# Patient Record
Sex: Male | Born: 1935 | Race: White | Hispanic: No | Marital: Married
Health system: Southern US, Community
[De-identification: ages and names within clinical notes are randomized; demographics above are authoritative.]

---

## 2005-01-18 ENCOUNTER — Inpatient Hospital Stay: Payer: Self-pay | Admitting: Internal Medicine

## 2005-01-18 ENCOUNTER — Other Ambulatory Visit: Payer: Self-pay

## 2005-04-20 ENCOUNTER — Ambulatory Visit: Payer: Self-pay | Admitting: Unknown Physician Specialty

## 2007-06-20 ENCOUNTER — Emergency Department: Payer: Self-pay | Admitting: Internal Medicine

## 2007-06-20 ENCOUNTER — Other Ambulatory Visit: Payer: Self-pay

## 2007-06-23 ENCOUNTER — Ambulatory Visit: Payer: Self-pay | Admitting: Internal Medicine

## 2007-07-29 ENCOUNTER — Other Ambulatory Visit: Payer: Self-pay

## 2007-07-31 ENCOUNTER — Inpatient Hospital Stay: Payer: Self-pay | Admitting: *Deleted

## 2010-12-06 ENCOUNTER — Inpatient Hospital Stay: Payer: Self-pay | Admitting: Family Medicine

## 2011-03-08 ENCOUNTER — Emergency Department: Payer: Self-pay | Admitting: Emergency Medicine

## 2011-03-30 ENCOUNTER — Ambulatory Visit: Payer: Self-pay | Admitting: Surgery

## 2011-04-06 ENCOUNTER — Ambulatory Visit: Payer: Self-pay | Admitting: Surgery

## 2011-06-06 ENCOUNTER — Ambulatory Visit: Payer: Self-pay | Admitting: Internal Medicine

## 2011-06-29 ENCOUNTER — Inpatient Hospital Stay: Payer: Self-pay | Admitting: Family Medicine

## 2011-07-07 ENCOUNTER — Ambulatory Visit: Payer: Self-pay | Admitting: Internal Medicine

## 2011-09-03 ENCOUNTER — Emergency Department: Payer: Self-pay | Admitting: Emergency Medicine

## 2011-09-03 LAB — BASIC METABOLIC PANEL
BUN: 27 mg/dL — ABNORMAL HIGH (ref 7–18)
Calcium, Total: 8.9 mg/dL (ref 8.5–10.1)
Chloride: 101 mmol/L (ref 98–107)
Creatinine: 1.13 mg/dL (ref 0.60–1.30)
EGFR (African American): >60
EGFR (Non-African Amer.): >60
Glucose: 457 mg/dL — ABNORMAL HIGH (ref 65–99)
Potassium: 4.2 mmol/L (ref 3.5–5.1)
Sodium: 136 mmol/L (ref 136–145)

## 2011-09-03 LAB — URINALYSIS, COMPLETE
Bacteria: NONE SEEN
Glucose,UR: >=500 mg/dL (ref 0–75)
Hyaline Cast: 9
Ph: 6 (ref 4.5–8.0)
RBC,UR: 2 /HPF (ref 0–5)
Specific Gravity: 1.02 (ref 1.003–1.030)
WBC UR: <1 /HPF (ref 0–5)

## 2011-09-03 LAB — CBC
HCT: 35.3 % — ABNORMAL LOW (ref 40.0–52.0)
HGB: 12.1 g/dL — ABNORMAL LOW (ref 13.0–18.0)
MCH: 34.3 pg — ABNORMAL HIGH (ref 26.0–34.0)
MCHC: 34.2 g/dL (ref 32.0–36.0)
RDW: 14.8 % — ABNORMAL HIGH (ref 11.5–14.5)

## 2011-09-03 LAB — PROTIME-INR
INR: 1
Prothrombin Time: 13.7 secs (ref 11.5–14.7)

## 2011-09-03 LAB — APTT: Activated PTT: 33.7 secs (ref 23.6–35.9)

## 2011-10-20 ENCOUNTER — Emergency Department: Payer: Self-pay | Admitting: *Deleted

## 2011-10-21 LAB — URINALYSIS, COMPLETE
Bacteria: NONE SEEN
Glucose,UR: 150 mg/dL (ref 0–75)
RBC,UR: 4 /HPF (ref 0–5)
Specific Gravity: 1.028 (ref 1.003–1.030)
WBC UR: 4 /HPF (ref 0–5)

## 2011-11-12 ENCOUNTER — Emergency Department: Payer: Self-pay | Admitting: *Deleted

## 2012-01-11 ENCOUNTER — Emergency Department: Payer: Self-pay | Admitting: *Deleted

## 2012-01-11 LAB — CBC
MCH: 33.8 pg (ref 26.0–34.0)
MCHC: 33.8 g/dL (ref 32.0–36.0)
MCV: 100 fL (ref 80–100)
Platelet: 194 10*3/uL (ref 150–440)
RBC: 3.58 10*6/uL — ABNORMAL LOW (ref 4.40–5.90)
WBC: 4.8 10*3/uL (ref 3.8–10.6)

## 2012-01-11 LAB — HEPATIC FUNCTION PANEL A (ARMC)
Albumin: 3.2 g/dL — ABNORMAL LOW (ref 3.4–5.0)
Bilirubin, Direct: 0.1 mg/dL (ref 0.00–0.20)
Bilirubin,Total: 0.5 mg/dL (ref 0.2–1.0)
Total Protein: 6.1 g/dL — ABNORMAL LOW (ref 6.4–8.2)

## 2012-01-12 LAB — CK TOTAL AND CKMB (NOT AT ARMC)
CK, Total: 131 U/L (ref 35–232)
CK-MB: 3.3 ng/mL (ref 0.5–3.6)

## 2012-01-12 LAB — URINALYSIS, COMPLETE
Bacteria: NONE SEEN
Glucose,UR: >=500 mg/dL (ref 0–75)
Ketone: NEGATIVE
Leukocyte Esterase: NEGATIVE
Specific Gravity: 1.021 (ref 1.003–1.030)
Squamous Epithelial: NONE SEEN

## 2012-01-12 LAB — BASIC METABOLIC PANEL
Anion Gap: 9 (ref 7–16)
BUN: 25 mg/dL — ABNORMAL HIGH (ref 7–18)
Calcium, Total: 8.8 mg/dL (ref 8.5–10.1)
Chloride: 103 mmol/L (ref 98–107)
Co2: 29 mmol/L (ref 21–32)
Creatinine: 0.94 mg/dL (ref 0.60–1.30)
Osmolality: 302 (ref 275–301)

## 2012-01-13 ENCOUNTER — Ambulatory Visit: Payer: Self-pay | Admitting: Urology

## 2012-01-14 ENCOUNTER — Emergency Department: Payer: Self-pay | Admitting: Emergency Medicine

## 2012-01-14 LAB — URINALYSIS, COMPLETE
Bilirubin,UR: NEGATIVE
Glucose,UR: 150 mg/dL (ref 0–75)
Ketone: NEGATIVE
Leukocyte Esterase: NEGATIVE
Nitrite: NEGATIVE
RBC,UR: 1 /HPF (ref 0–5)
Squamous Epithelial: NONE SEEN
WBC UR: 1 /HPF (ref 0–5)

## 2012-01-14 LAB — COMPREHENSIVE METABOLIC PANEL
Albumin: 3.5 g/dL (ref 3.4–5.0)
Anion Gap: 9 (ref 7–16)
BUN: 11 mg/dL (ref 7–18)
Bilirubin,Total: 0.5 mg/dL (ref 0.2–1.0)
Calcium, Total: 8.4 mg/dL — ABNORMAL LOW (ref 8.5–10.1)
Co2: 30 mmol/L (ref 21–32)
Creatinine: 0.63 mg/dL (ref 0.60–1.30)
EGFR (Non-African Amer.): >60
Osmolality: 283 (ref 275–301)
Potassium: 3.1 mmol/L — ABNORMAL LOW (ref 3.5–5.1)
SGPT (ALT): 21 U/L
Sodium: 141 mmol/L (ref 136–145)

## 2012-01-14 LAB — CBC
HGB: 13.5 g/dL (ref 13.0–18.0)
MCHC: 34.4 g/dL (ref 32.0–36.0)
RDW: 13.9 % (ref 11.5–14.5)
WBC: 5.8 10*3/uL (ref 3.8–10.6)

## 2012-01-14 LAB — TROPONIN I: Troponin-I: 0.03 ng/mL

## 2012-01-17 ENCOUNTER — Inpatient Hospital Stay: Payer: Self-pay | Admitting: Family Medicine

## 2012-01-17 LAB — COMPREHENSIVE METABOLIC PANEL
Albumin: 3.5 g/dL (ref 3.4–5.0)
BUN: 18 mg/dL (ref 7–18)
Chloride: 98 mmol/L (ref 98–107)
Co2: 27 mmol/L (ref 21–32)
Creatinine: 0.87 mg/dL (ref 0.60–1.30)
EGFR (Non-African Amer.): >60
Glucose: 404 mg/dL — ABNORMAL HIGH (ref 65–99)
Osmolality: 293 (ref 275–301)
Potassium: 4 mmol/L (ref 3.5–5.1)
SGOT(AST): 17 U/L (ref 15–37)
Sodium: 137 mmol/L (ref 136–145)
Total Protein: 6.7 g/dL (ref 6.4–8.2)

## 2012-01-17 LAB — URINALYSIS, COMPLETE
Glucose,UR: >=500 mg/dL (ref 0–75)
Hyaline Cast: 4
Ph: 5 (ref 4.5–8.0)
Protein: NEGATIVE
Specific Gravity: 1.016 (ref 1.003–1.030)
WBC UR: 7 /HPF (ref 0–5)

## 2012-01-17 LAB — CK TOTAL AND CKMB (NOT AT ARMC)
CK, Total: 103 U/L (ref 35–232)
CK-MB: 2.9 ng/mL (ref 0.5–3.6)

## 2012-01-17 LAB — CBC
HCT: 38.9 % — ABNORMAL LOW (ref 40.0–52.0)
MCHC: 34.2 g/dL (ref 32.0–36.0)
MCV: 101 fL — ABNORMAL HIGH (ref 80–100)
RDW: 13.8 % (ref 11.5–14.5)

## 2012-01-17 LAB — TROPONIN I: Troponin-I: 0.03 ng/mL

## 2012-01-18 LAB — CBC WITH DIFFERENTIAL/PLATELET
Basophil #: 0 10*3/uL (ref 0.0–0.1)
Basophil %: 0.3 %
Eosinophil #: 0.1 10*3/uL (ref 0.0–0.7)
Eosinophil %: 0.8 %
HCT: 38.7 % — ABNORMAL LOW (ref 40.0–52.0)
HGB: 13.5 g/dL (ref 13.0–18.0)
Lymphocyte #: 0.9 10*3/uL — ABNORMAL LOW (ref 1.0–3.6)
Lymphocyte %: 11.1 %
MCH: 34.7 pg — ABNORMAL HIGH (ref 26.0–34.0)
MCHC: 34.8 g/dL (ref 32.0–36.0)
Monocyte #: 0.5 x10 3/mm (ref 0.2–1.0)
Neutrophil #: 6.6 10*3/uL — ABNORMAL HIGH (ref 1.4–6.5)

## 2012-01-18 LAB — COMPREHENSIVE METABOLIC PANEL
Albumin: 3.1 g/dL — ABNORMAL LOW (ref 3.4–5.0)
Anion Gap: 11 (ref 7–16)
BUN: 10 mg/dL (ref 7–18)
Bilirubin,Total: 1.1 mg/dL — ABNORMAL HIGH (ref 0.2–1.0)
Chloride: 100 mmol/L (ref 98–107)
Creatinine: 0.7 mg/dL (ref 0.60–1.30)
Glucose: 245 mg/dL — ABNORMAL HIGH (ref 65–99)
Osmolality: 288 (ref 275–301)
SGOT(AST): 16 U/L (ref 15–37)
Sodium: 141 mmol/L (ref 136–145)
Total Protein: 6.4 g/dL (ref 6.4–8.2)

## 2012-01-19 LAB — CBC WITH DIFFERENTIAL/PLATELET
Basophil %: 0.2 %
Eosinophil #: 0.1 10*3/uL (ref 0.0–0.7)
Eosinophil %: 0.6 %
HCT: 37.6 % — ABNORMAL LOW (ref 40.0–52.0)
HGB: 12.7 g/dL — ABNORMAL LOW (ref 13.0–18.0)
Lymphocyte %: 15.7 %
MCH: 33.2 pg (ref 26.0–34.0)
MCHC: 33.8 g/dL (ref 32.0–36.0)
Monocyte #: 0.6 x10 3/mm (ref 0.2–1.0)
Neutrophil #: 7 10*3/uL — ABNORMAL HIGH (ref 1.4–6.5)
Neutrophil %: 76.9 %

## 2012-01-19 LAB — BASIC METABOLIC PANEL
Anion Gap: 10 (ref 7–16)
BUN: 8 mg/dL (ref 7–18)
Chloride: 103 mmol/L (ref 98–107)
Co2: 29 mmol/L (ref 21–32)
Glucose: 33 mg/dL — CL (ref 65–99)
Osmolality: 278 (ref 275–301)
Potassium: 2.7 mmol/L — ABNORMAL LOW (ref 3.5–5.1)

## 2012-01-19 LAB — LIPASE, BLOOD: Lipase: 83 U/L (ref 73–393)

## 2012-01-19 LAB — URINE CULTURE

## 2012-01-20 LAB — BASIC METABOLIC PANEL
Anion Gap: 10 (ref 7–16)
BUN: 9 mg/dL (ref 7–18)
Co2: 29 mmol/L (ref 21–32)
Creatinine: 0.54 mg/dL — ABNORMAL LOW (ref 0.60–1.30)
EGFR (African American): >60
EGFR (Non-African Amer.): >60
Glucose: 100 mg/dL — ABNORMAL HIGH (ref 65–99)
Osmolality: 280 (ref 275–301)
Sodium: 141 mmol/L (ref 136–145)

## 2012-01-21 LAB — BASIC METABOLIC PANEL
BUN: 19 mg/dL — ABNORMAL HIGH (ref 7–18)
Calcium, Total: 8.5 mg/dL (ref 8.5–10.1)
Co2: 33 mmol/L — ABNORMAL HIGH (ref 21–32)
Creatinine: 0.79 mg/dL (ref 0.60–1.30)
EGFR (African American): >60
Potassium: 3.6 mmol/L (ref 3.5–5.1)
Sodium: 143 mmol/L (ref 136–145)

## 2012-01-21 LAB — URINALYSIS, COMPLETE
Bacteria: NONE SEEN
Bilirubin,UR: NEGATIVE
Glucose,UR: >=500 mg/dL (ref 0–75)
Ketone: NEGATIVE
Leukocyte Esterase: NEGATIVE
Nitrite: NEGATIVE
RBC,UR: 3 /HPF (ref 0–5)
Specific Gravity: 1.028 (ref 1.003–1.030)
WBC UR: 1 /HPF (ref 0–5)

## 2012-01-22 LAB — BASIC METABOLIC PANEL
Anion Gap: 7 (ref 7–16)
BUN: 19 mg/dL — ABNORMAL HIGH (ref 7–18)
Chloride: 105 mmol/L (ref 98–107)
EGFR (Non-African Amer.): >60
Osmolality: 287 (ref 275–301)
Potassium: 3.9 mmol/L (ref 3.5–5.1)
Sodium: 142 mmol/L (ref 136–145)

## 2012-01-23 LAB — CULTURE, BLOOD (SINGLE)

## 2012-02-18 ENCOUNTER — Inpatient Hospital Stay: Payer: Self-pay | Admitting: Family Medicine

## 2012-02-18 LAB — COMPREHENSIVE METABOLIC PANEL
Alkaline Phosphatase: 127 U/L (ref 50–136)
Bilirubin,Total: 0.4 mg/dL (ref 0.2–1.0)
Calcium, Total: 8.9 mg/dL (ref 8.5–10.1)
Chloride: 105 mmol/L (ref 98–107)
Co2: 27 mmol/L (ref 21–32)
EGFR (African American): >60
EGFR (Non-African Amer.): >60
Glucose: 320 mg/dL — ABNORMAL HIGH (ref 65–99)
Potassium: 3.5 mmol/L (ref 3.5–5.1)
SGOT(AST): 21 U/L (ref 15–37)
SGPT (ALT): 22 U/L (ref 12–78)

## 2012-02-18 LAB — URINALYSIS, COMPLETE
Bacteria: NONE SEEN
Bilirubin,UR: NEGATIVE
Glucose,UR: >=500 mg/dL (ref 0–75)
Leukocyte Esterase: NEGATIVE
Nitrite: NEGATIVE
Ph: 5 (ref 4.5–8.0)
Protein: NEGATIVE

## 2012-02-18 LAB — CK TOTAL AND CKMB (NOT AT ARMC)
CK, Total: 82 U/L (ref 35–232)
CK-MB: 4.1 ng/mL — ABNORMAL HIGH (ref 0.5–3.6)

## 2012-02-18 LAB — TROPONIN I
Troponin-I: <0.02 ng/mL
Troponin-I: 0.02 ng/mL

## 2012-02-18 LAB — MAGNESIUM: Magnesium: 2 mg/dL

## 2012-02-18 LAB — CBC
HGB: 13.6 g/dL (ref 13.0–18.0)
MCH: 34.1 pg — ABNORMAL HIGH (ref 26.0–34.0)
MCV: 100 fL (ref 80–100)
Platelet: 172 10*3/uL (ref 150–440)
RBC: 3.99 10*6/uL — ABNORMAL LOW (ref 4.40–5.90)
WBC: 4.7 10*3/uL (ref 3.8–10.6)

## 2012-02-19 LAB — BASIC METABOLIC PANEL
Anion Gap: 7 (ref 7–16)
Chloride: 105 mmol/L (ref 98–107)
Co2: 29 mmol/L (ref 21–32)
EGFR (Non-African Amer.): >60
Osmolality: 278 (ref 275–301)
Potassium: 2.6 mmol/L — ABNORMAL LOW (ref 3.5–5.1)
Sodium: 141 mmol/L (ref 136–145)

## 2012-02-19 LAB — CBC WITH DIFFERENTIAL/PLATELET
Basophil #: 0 10*3/uL (ref 0.0–0.1)
Basophil %: 0.2 %
Eosinophil #: 0.1 10*3/uL (ref 0.0–0.7)
Eosinophil %: 1.6 %
HGB: 12.7 g/dL — ABNORMAL LOW (ref 13.0–18.0)
Lymphocyte %: 34.1 %
MCH: 33.4 pg (ref 26.0–34.0)
MCHC: 34 g/dL (ref 32.0–36.0)
MCV: 98 fL (ref 80–100)
Monocyte #: 0.5 x10 3/mm (ref 0.2–1.0)
Neutrophil #: 4.1 10*3/uL (ref 1.4–6.5)
Neutrophil %: 57.2 %
Platelet: 178 10*3/uL (ref 150–440)
RDW: 14.1 % (ref 11.5–14.5)
WBC: 7.1 10*3/uL (ref 3.8–10.6)

## 2012-02-20 LAB — CBC WITH DIFFERENTIAL/PLATELET
Basophil %: 0.2 %
Eosinophil %: 0.9 %
HCT: 39.4 % — ABNORMAL LOW (ref 40.0–52.0)
HGB: 13.6 g/dL (ref 13.0–18.0)
Lymphocyte #: 1.4 10*3/uL (ref 1.0–3.6)
MCV: 97 fL (ref 80–100)
Monocyte %: 7.4 %
Neutrophil #: 5.2 10*3/uL (ref 1.4–6.5)
Platelet: 183 10*3/uL (ref 150–440)
RBC: 4.04 10*6/uL — ABNORMAL LOW (ref 4.40–5.90)
WBC: 7.2 10*3/uL (ref 3.8–10.6)

## 2012-02-20 LAB — BASIC METABOLIC PANEL
Anion Gap: 11 (ref 7–16)
BUN: 14 mg/dL (ref 7–18)
Calcium, Total: 9 mg/dL (ref 8.5–10.1)
Co2: 28 mmol/L (ref 21–32)
Creatinine: 0.5 mg/dL — ABNORMAL LOW (ref 0.60–1.30)
EGFR (African American): >60
Glucose: 33 mg/dL — CL (ref 65–99)
Osmolality: 278 (ref 275–301)
Sodium: 141 mmol/L (ref 136–145)

## 2012-02-20 LAB — TSH: Thyroid Stimulating Horm: 1.5 u[IU]/mL

## 2012-02-21 LAB — CBC WITH DIFFERENTIAL/PLATELET
Basophil #: 0 10*3/uL (ref 0.0–0.1)
Eosinophil %: 1.9 %
HGB: 13 g/dL (ref 13.0–18.0)
Lymphocyte #: 1.3 10*3/uL (ref 1.0–3.6)
Lymphocyte %: 22.4 %
MCH: 33.4 pg (ref 26.0–34.0)
Monocyte #: 0.4 x10 3/mm (ref 0.2–1.0)
Neutrophil %: 68.7 %
Platelet: 160 10*3/uL (ref 150–440)
RBC: 3.89 10*6/uL — ABNORMAL LOW (ref 4.40–5.90)

## 2012-02-21 LAB — BASIC METABOLIC PANEL
BUN: 14 mg/dL (ref 7–18)
Creatinine: 0.46 mg/dL — ABNORMAL LOW (ref 0.60–1.30)
EGFR (Non-African Amer.): >60
Glucose: 262 mg/dL — ABNORMAL HIGH (ref 65–99)
Potassium: 3.9 mmol/L (ref 3.5–5.1)
Sodium: 137 mmol/L (ref 136–145)

## 2012-02-22 LAB — BASIC METABOLIC PANEL
Anion Gap: 6 — ABNORMAL LOW (ref 7–16)
BUN: 15 mg/dL (ref 7–18)
Calcium, Total: 8.6 mg/dL (ref 8.5–10.1)
Chloride: 101 mmol/L (ref 98–107)
Co2: 32 mmol/L (ref 21–32)
EGFR (Non-African Amer.): >60
Glucose: 147 mg/dL — ABNORMAL HIGH (ref 65–99)
Osmolality: 281 (ref 275–301)
Potassium: 3.8 mmol/L (ref 3.5–5.1)

## 2012-02-23 LAB — BASIC METABOLIC PANEL
Anion Gap: 8 (ref 7–16)
BUN: 17 mg/dL (ref 7–18)
Chloride: 101 mmol/L (ref 98–107)
Creatinine: 0.66 mg/dL (ref 0.60–1.30)
EGFR (African American): >60
EGFR (Non-African Amer.): >60
Glucose: 270 mg/dL — ABNORMAL HIGH (ref 65–99)
Osmolality: 285 (ref 275–301)
Potassium: 4.1 mmol/L (ref 3.5–5.1)

## 2012-02-25 LAB — APTT: Activated PTT: 29.6 secs (ref 23.6–35.9)

## 2012-02-26 LAB — CBC WITH DIFFERENTIAL/PLATELET
Basophil %: 0.5 %
Eosinophil #: 0.1 10*3/uL (ref 0.0–0.7)
Eosinophil %: 1.6 %
Lymphocyte #: 2 10*3/uL (ref 1.0–3.6)
Lymphocyte %: 24 %
MCH: 33.8 pg (ref 26.0–34.0)
MCHC: 34.2 g/dL (ref 32.0–36.0)
MCV: 99 fL (ref 80–100)
Monocyte #: 0.6 x10 3/mm (ref 0.2–1.0)
Monocyte %: 6.7 %
Neutrophil %: 67.2 %
Platelet: 229 10*3/uL (ref 150–440)
RBC: 4.27 10*6/uL — ABNORMAL LOW (ref 4.40–5.90)

## 2012-02-26 LAB — BASIC METABOLIC PANEL
Anion Gap: 9 (ref 7–16)
Chloride: 105 mmol/L (ref 98–107)
Co2: 26 mmol/L (ref 21–32)
EGFR (Non-African Amer.): >60
Potassium: 3.8 mmol/L (ref 3.5–5.1)
Sodium: 140 mmol/L (ref 136–145)

## 2012-03-01 LAB — BASIC METABOLIC PANEL
Anion Gap: 8 (ref 7–16)
Chloride: 107 mmol/L (ref 98–107)
Co2: 25 mmol/L (ref 21–32)
Creatinine: 0.75 mg/dL (ref 0.60–1.30)
EGFR (African American): >60
Potassium: 3.7 mmol/L (ref 3.5–5.1)
Sodium: 140 mmol/L (ref 136–145)

## 2012-04-03 ENCOUNTER — Observation Stay: Payer: Self-pay

## 2012-04-03 LAB — COMPREHENSIVE METABOLIC PANEL
Albumin: 3.4 g/dL (ref 3.4–5.0)
BUN: 12 mg/dL (ref 7–18)
Bilirubin,Total: 0.6 mg/dL (ref 0.2–1.0)
Chloride: 101 mmol/L (ref 98–107)
Creatinine: 0.85 mg/dL (ref 0.60–1.30)
EGFR (African American): >60
EGFR (Non-African Amer.): >60
Glucose: 392 mg/dL — ABNORMAL HIGH (ref 65–99)
Potassium: 4.2 mmol/L (ref 3.5–5.1)
SGOT(AST): 22 U/L (ref 15–37)
SGPT (ALT): 21 U/L (ref 12–78)
Total Protein: 7 g/dL (ref 6.4–8.2)

## 2012-04-03 LAB — URINALYSIS, COMPLETE
Leukocyte Esterase: NEGATIVE
Nitrite: NEGATIVE
Protein: NEGATIVE
Specific Gravity: 1.026 (ref 1.003–1.030)
Squamous Epithelial: NONE SEEN
WBC UR: 1 /HPF (ref 0–5)

## 2012-04-03 LAB — CBC WITH DIFFERENTIAL/PLATELET
Basophil #: 0 10*3/uL (ref 0.0–0.1)
Eosinophil %: 0.5 %
HCT: 38.3 % — ABNORMAL LOW (ref 40.0–52.0)
Lymphocyte #: 1.2 10*3/uL (ref 1.0–3.6)
MCH: 34.3 pg — ABNORMAL HIGH (ref 26.0–34.0)
MCHC: 34.5 g/dL (ref 32.0–36.0)
MCV: 99 fL (ref 80–100)
Neutrophil %: 78 %
Platelet: 200 10*3/uL (ref 150–440)
RBC: 3.86 10*6/uL — ABNORMAL LOW (ref 4.40–5.90)
RDW: 14.2 % (ref 11.5–14.5)

## 2012-04-03 LAB — TROPONIN I: Troponin-I: 0.04 ng/mL

## 2012-04-03 LAB — CK TOTAL AND CKMB (NOT AT ARMC): CK-MB: 4.9 ng/mL — ABNORMAL HIGH (ref 0.5–3.6)

## 2012-04-04 LAB — HEMOGLOBIN A1C: Hemoglobin A1C: 7.9 % — ABNORMAL HIGH (ref 4.2–6.3)

## 2012-04-11 ENCOUNTER — Emergency Department: Payer: Self-pay | Admitting: Emergency Medicine

## 2012-08-29 LAB — CBC
HGB: 14.3 g/dL (ref 13.0–18.0)
MCHC: 33.7 g/dL (ref 32.0–36.0)
Platelet: 193 10*3/uL (ref 150–440)
RBC: 4.34 10*6/uL — ABNORMAL LOW (ref 4.40–5.90)
WBC: 6.4 10*3/uL (ref 3.8–10.6)

## 2012-08-29 LAB — URINALYSIS, COMPLETE
Glucose,UR: >=500 mg/dL (ref 0–75)
Ph: 6 (ref 4.5–8.0)
Protein: NEGATIVE
RBC,UR: 1 /HPF (ref 0–5)
Specific Gravity: 1.027 (ref 1.003–1.030)
WBC UR: 1 /HPF (ref 0–5)

## 2012-08-29 LAB — COMPREHENSIVE METABOLIC PANEL
Bilirubin,Total: 0.7 mg/dL (ref 0.2–1.0)
Calcium, Total: 8.2 mg/dL — ABNORMAL LOW (ref 8.5–10.1)
Chloride: 104 mmol/L (ref 98–107)
Creatinine: 0.83 mg/dL (ref 0.60–1.30)
EGFR (Non-African Amer.): >60
Glucose: 522 mg/dL (ref 65–99)
Osmolality: 299 (ref 275–301)
Potassium: 4 mmol/L (ref 3.5–5.1)
SGOT(AST): 35 U/L (ref 15–37)
Sodium: 137 mmol/L (ref 136–145)
Total Protein: 6.8 g/dL (ref 6.4–8.2)

## 2012-08-29 LAB — TROPONIN I: Troponin-I: 0.03 ng/mL

## 2012-08-29 LAB — CK TOTAL AND CKMB (NOT AT ARMC): CK-MB: 6.4 ng/mL — ABNORMAL HIGH (ref 0.5–3.6)

## 2012-08-30 LAB — COMPREHENSIVE METABOLIC PANEL
Albumin: 2.9 g/dL — ABNORMAL LOW (ref 3.4–5.0)
BUN: 12 mg/dL (ref 7–18)
Bilirubin,Total: 0.6 mg/dL (ref 0.2–1.0)
Co2: 29 mmol/L (ref 21–32)
Creatinine: 0.66 mg/dL (ref 0.60–1.30)
EGFR (Non-African Amer.): >60
Glucose: 204 mg/dL — ABNORMAL HIGH (ref 65–99)
Osmolality: 292 (ref 275–301)
Potassium: 3 mmol/L — ABNORMAL LOW (ref 3.5–5.1)
SGOT(AST): 22 U/L (ref 15–37)
Sodium: 144 mmol/L (ref 136–145)

## 2012-08-30 LAB — CBC WITH DIFFERENTIAL/PLATELET
Basophil #: 0.1 10*3/uL (ref 0.0–0.1)
Eosinophil #: 0.2 10*3/uL (ref 0.0–0.7)
Eosinophil %: 2.2 %
HGB: 13 g/dL (ref 13.0–18.0)
Lymphocyte #: 1.3 10*3/uL (ref 1.0–3.6)
Lymphocyte %: 18.3 %
MCHC: 32.9 g/dL (ref 32.0–36.0)
Monocyte #: 0.5 x10 3/mm (ref 0.2–1.0)
Monocyte %: 6.6 %
Neutrophil #: 4.9 10*3/uL (ref 1.4–6.5)
Neutrophil %: 71.8 %
RBC: 4.08 10*6/uL — ABNORMAL LOW (ref 4.40–5.90)
RDW: 14.2 % (ref 11.5–14.5)
WBC: 6.8 10*3/uL (ref 3.8–10.6)

## 2012-08-30 LAB — CK TOTAL AND CKMB (NOT AT ARMC): CK-MB: 3.5 ng/mL (ref 0.5–3.6)

## 2012-08-30 LAB — TROPONIN I
Troponin-I: 0.05 ng/mL
Troponin-I: 0.05 ng/mL

## 2012-08-31 ENCOUNTER — Inpatient Hospital Stay: Payer: Self-pay | Admitting: Internal Medicine

## 2012-08-31 LAB — BASIC METABOLIC PANEL
Anion Gap: 7 (ref 7–16)
Calcium, Total: 8.1 mg/dL — ABNORMAL LOW (ref 8.5–10.1)
Chloride: 109 mmol/L — ABNORMAL HIGH (ref 98–107)
EGFR (Non-African Amer.): >60
Glucose: 27 mg/dL — CL (ref 65–99)
Sodium: 144 mmol/L (ref 136–145)

## 2012-09-01 LAB — BASIC METABOLIC PANEL
Anion Gap: 3 — ABNORMAL LOW (ref 7–16)
Co2: 31 mmol/L (ref 21–32)
Creatinine: 0.6 mg/dL (ref 0.60–1.30)
Glucose: 34 mg/dL — CL (ref 65–99)
Sodium: 143 mmol/L (ref 136–145)

## 2012-09-03 LAB — BASIC METABOLIC PANEL
BUN: 16 mg/dL (ref 7–18)
Calcium, Total: 8.2 mg/dL — ABNORMAL LOW (ref 8.5–10.1)
Co2: 27 mmol/L (ref 21–32)
Creatinine: 0.65 mg/dL (ref 0.60–1.30)
EGFR (African American): >60
EGFR (Non-African Amer.): >60
Potassium: 3.8 mmol/L (ref 3.5–5.1)

## 2012-09-06 DIAGNOSIS — E109 Type 1 diabetes mellitus without complications: Secondary | ICD-10-CM

## 2012-09-06 DIAGNOSIS — J159 Unspecified bacterial pneumonia: Secondary | ICD-10-CM

## 2012-09-06 DIAGNOSIS — I10 Essential (primary) hypertension: Secondary | ICD-10-CM

## 2012-09-06 DIAGNOSIS — F068 Other specified mental disorders due to known physiological condition: Secondary | ICD-10-CM

## 2012-10-06 DIAGNOSIS — N400 Enlarged prostate without lower urinary tract symptoms: Secondary | ICD-10-CM

## 2012-10-06 DIAGNOSIS — E109 Type 1 diabetes mellitus without complications: Secondary | ICD-10-CM

## 2012-10-06 DIAGNOSIS — F068 Other specified mental disorders due to known physiological condition: Secondary | ICD-10-CM

## 2012-10-06 DIAGNOSIS — I10 Essential (primary) hypertension: Secondary | ICD-10-CM

## 2012-11-10 DIAGNOSIS — I10 Essential (primary) hypertension: Secondary | ICD-10-CM

## 2012-11-10 DIAGNOSIS — N400 Enlarged prostate without lower urinary tract symptoms: Secondary | ICD-10-CM

## 2012-11-10 DIAGNOSIS — F068 Other specified mental disorders due to known physiological condition: Secondary | ICD-10-CM

## 2012-11-10 DIAGNOSIS — E109 Type 1 diabetes mellitus without complications: Secondary | ICD-10-CM

## 2012-12-05 DIAGNOSIS — F068 Other specified mental disorders due to known physiological condition: Secondary | ICD-10-CM

## 2012-12-05 DIAGNOSIS — N400 Enlarged prostate without lower urinary tract symptoms: Secondary | ICD-10-CM

## 2012-12-05 DIAGNOSIS — E109 Type 1 diabetes mellitus without complications: Secondary | ICD-10-CM

## 2012-12-05 DIAGNOSIS — I10 Essential (primary) hypertension: Secondary | ICD-10-CM

## 2012-12-07 ENCOUNTER — Ambulatory Visit: Payer: Self-pay | Admitting: Internal Medicine

## 2013-02-12 ENCOUNTER — Emergency Department: Payer: Self-pay | Admitting: Emergency Medicine

## 2013-02-12 LAB — URINALYSIS, COMPLETE
Bacteria: NONE SEEN
Bilirubin,UR: NEGATIVE
Blood: NEGATIVE
Nitrite: NEGATIVE
RBC,UR: 2 /HPF (ref 0–5)
Specific Gravity: 1.028 (ref 1.003–1.030)

## 2013-02-12 LAB — COMPREHENSIVE METABOLIC PANEL
Albumin: 3.4 g/dL (ref 3.4–5.0)
Alkaline Phosphatase: 97 U/L (ref 50–136)
Anion Gap: 5 — ABNORMAL LOW (ref 7–16)
Bilirubin,Total: 0.5 mg/dL (ref 0.2–1.0)
Chloride: 106 mmol/L (ref 98–107)
EGFR (African American): >60
EGFR (Non-African Amer.): >60
Osmolality: 303 (ref 275–301)
SGOT(AST): 27 U/L (ref 15–37)
SGPT (ALT): 18 U/L (ref 12–78)
Sodium: 141 mmol/L (ref 136–145)
Total Protein: 6.9 g/dL (ref 6.4–8.2)

## 2013-02-12 LAB — CBC
HCT: 39.5 % — ABNORMAL LOW (ref 40.0–52.0)
HGB: 13.7 g/dL (ref 13.0–18.0)
Platelet: 205 10*3/uL (ref 150–440)
RBC: 4.08 10*6/uL — ABNORMAL LOW (ref 4.40–5.90)
RDW: 13.9 % (ref 11.5–14.5)

## 2013-02-13 LAB — URINALYSIS, COMPLETE
Bilirubin,UR: NEGATIVE
Blood: NEGATIVE
Ketone: NEGATIVE
Protein: NEGATIVE
RBC,UR: 9 /HPF (ref 0–5)
Specific Gravity: 1.013 (ref 1.003–1.030)
WBC UR: 4 /HPF (ref 0–5)

## 2013-02-13 LAB — COMPREHENSIVE METABOLIC PANEL
Albumin: 3.2 g/dL — ABNORMAL LOW (ref 3.4–5.0)
Alkaline Phosphatase: 89 U/L (ref 50–136)
Calcium, Total: 8.4 mg/dL — ABNORMAL LOW (ref 8.5–10.1)
Co2: 32 mmol/L (ref 21–32)
Creatinine: 0.7 mg/dL (ref 0.60–1.30)
Glucose: 68 mg/dL (ref 65–99)
Potassium: 2.7 mmol/L — ABNORMAL LOW (ref 3.5–5.1)
SGPT (ALT): 17 U/L (ref 12–78)
Sodium: 145 mmol/L (ref 136–145)

## 2013-02-13 LAB — TROPONIN I
Troponin-I: 0.04 ng/mL
Troponin-I: 0.04 ng/mL

## 2013-02-13 LAB — MAGNESIUM: Magnesium: 1.7 mg/dL — ABNORMAL LOW

## 2013-02-13 LAB — CBC
HCT: 37.5 % — ABNORMAL LOW (ref 40.0–52.0)
MCH: 33.9 pg (ref 26.0–34.0)
Platelet: 203 10*3/uL (ref 150–440)

## 2013-02-14 LAB — COMPREHENSIVE METABOLIC PANEL
Albumin: 2.8 g/dL — ABNORMAL LOW (ref 3.4–5.0)
Alkaline Phosphatase: 82 U/L (ref 50–136)
Anion Gap: 6 — ABNORMAL LOW (ref 7–16)
Calcium, Total: 8 mg/dL — ABNORMAL LOW (ref 8.5–10.1)
Chloride: 105 mmol/L (ref 98–107)
Co2: 29 mmol/L (ref 21–32)
Creatinine: 0.6 mg/dL (ref 0.60–1.30)
EGFR (African American): >60
EGFR (Non-African Amer.): >60
Glucose: 111 mg/dL — ABNORMAL HIGH (ref 65–99)
Osmolality: 279 (ref 275–301)
SGOT(AST): 23 U/L (ref 15–37)
SGPT (ALT): 17 U/L (ref 12–78)
Sodium: 140 mmol/L (ref 136–145)

## 2013-02-14 LAB — CBC WITH DIFFERENTIAL/PLATELET
Basophil #: 0 10*3/uL (ref 0.0–0.1)
Basophil %: 0.7 %
Eosinophil %: 3.5 %
HCT: 36.5 % — ABNORMAL LOW (ref 40.0–52.0)
HGB: 12.9 g/dL — ABNORMAL LOW (ref 13.0–18.0)
Lymphocyte #: 1.4 10*3/uL (ref 1.0–3.6)
Lymphocyte %: 23.6 %
MCHC: 35.3 g/dL (ref 32.0–36.0)
Monocyte %: 6.4 %
Neutrophil #: 3.9 10*3/uL (ref 1.4–6.5)
Neutrophil %: 65.8 %
Platelet: 186 10*3/uL (ref 150–440)
RBC: 3.81 10*6/uL — ABNORMAL LOW (ref 4.40–5.90)
RDW: 13.8 % (ref 11.5–14.5)
WBC: 5.9 10*3/uL (ref 3.8–10.6)

## 2013-02-15 ENCOUNTER — Inpatient Hospital Stay: Payer: Self-pay | Admitting: Internal Medicine

## 2013-02-15 LAB — BASIC METABOLIC PANEL
Anion Gap: 2 — ABNORMAL LOW (ref 7–16)
BUN: 11 mg/dL (ref 7–18)
Calcium, Total: 8.8 mg/dL (ref 8.5–10.1)
Chloride: 104 mmol/L (ref 98–107)
Co2: 32 mmol/L (ref 21–32)
EGFR (African American): >60
EGFR (Non-African Amer.): >60
Glucose: 122 mg/dL — ABNORMAL HIGH (ref 65–99)
Osmolality: 276 (ref 275–301)
Potassium: 4.2 mmol/L (ref 3.5–5.1)
Sodium: 138 mmol/L (ref 136–145)

## 2013-02-15 LAB — CBC WITH DIFFERENTIAL/PLATELET
Basophil %: 0.5 %
Eosinophil #: 0.2 10*3/uL (ref 0.0–0.7)
Eosinophil %: 3.3 %
HGB: 13.8 g/dL (ref 13.0–18.0)
Lymphocyte %: 23.3 %
MCV: 97 fL (ref 80–100)
Monocyte #: 0.4 x10 3/mm (ref 0.2–1.0)
Neutrophil #: 4.4 10*3/uL (ref 1.4–6.5)
Neutrophil %: 66.3 %
Platelet: 192 10*3/uL (ref 150–440)
RBC: 4 10*6/uL — ABNORMAL LOW (ref 4.40–5.90)
RDW: 13.8 % (ref 11.5–14.5)
WBC: 6.6 10*3/uL (ref 3.8–10.6)

## 2013-02-16 LAB — BASIC METABOLIC PANEL
Anion Gap: 3 — ABNORMAL LOW (ref 7–16)
BUN: 15 mg/dL (ref 7–18)
Chloride: 103 mmol/L (ref 98–107)
Osmolality: 282 (ref 275–301)
Potassium: 3.8 mmol/L (ref 3.5–5.1)

## 2013-03-02 IMAGING — CR DG CHEST 1V PORT
1 series · 1 of 1 positions shown · non-contrast
Comparison: none

REASON FOR EXAM: weakness
COMMENTS:

PROCEDURE:     DXR - DXR PORTABLE CHEST SINGLE VIEW  - January 14, 2012  [DATE]
RESULT:     Comparison: 06/29/2011

[portable]
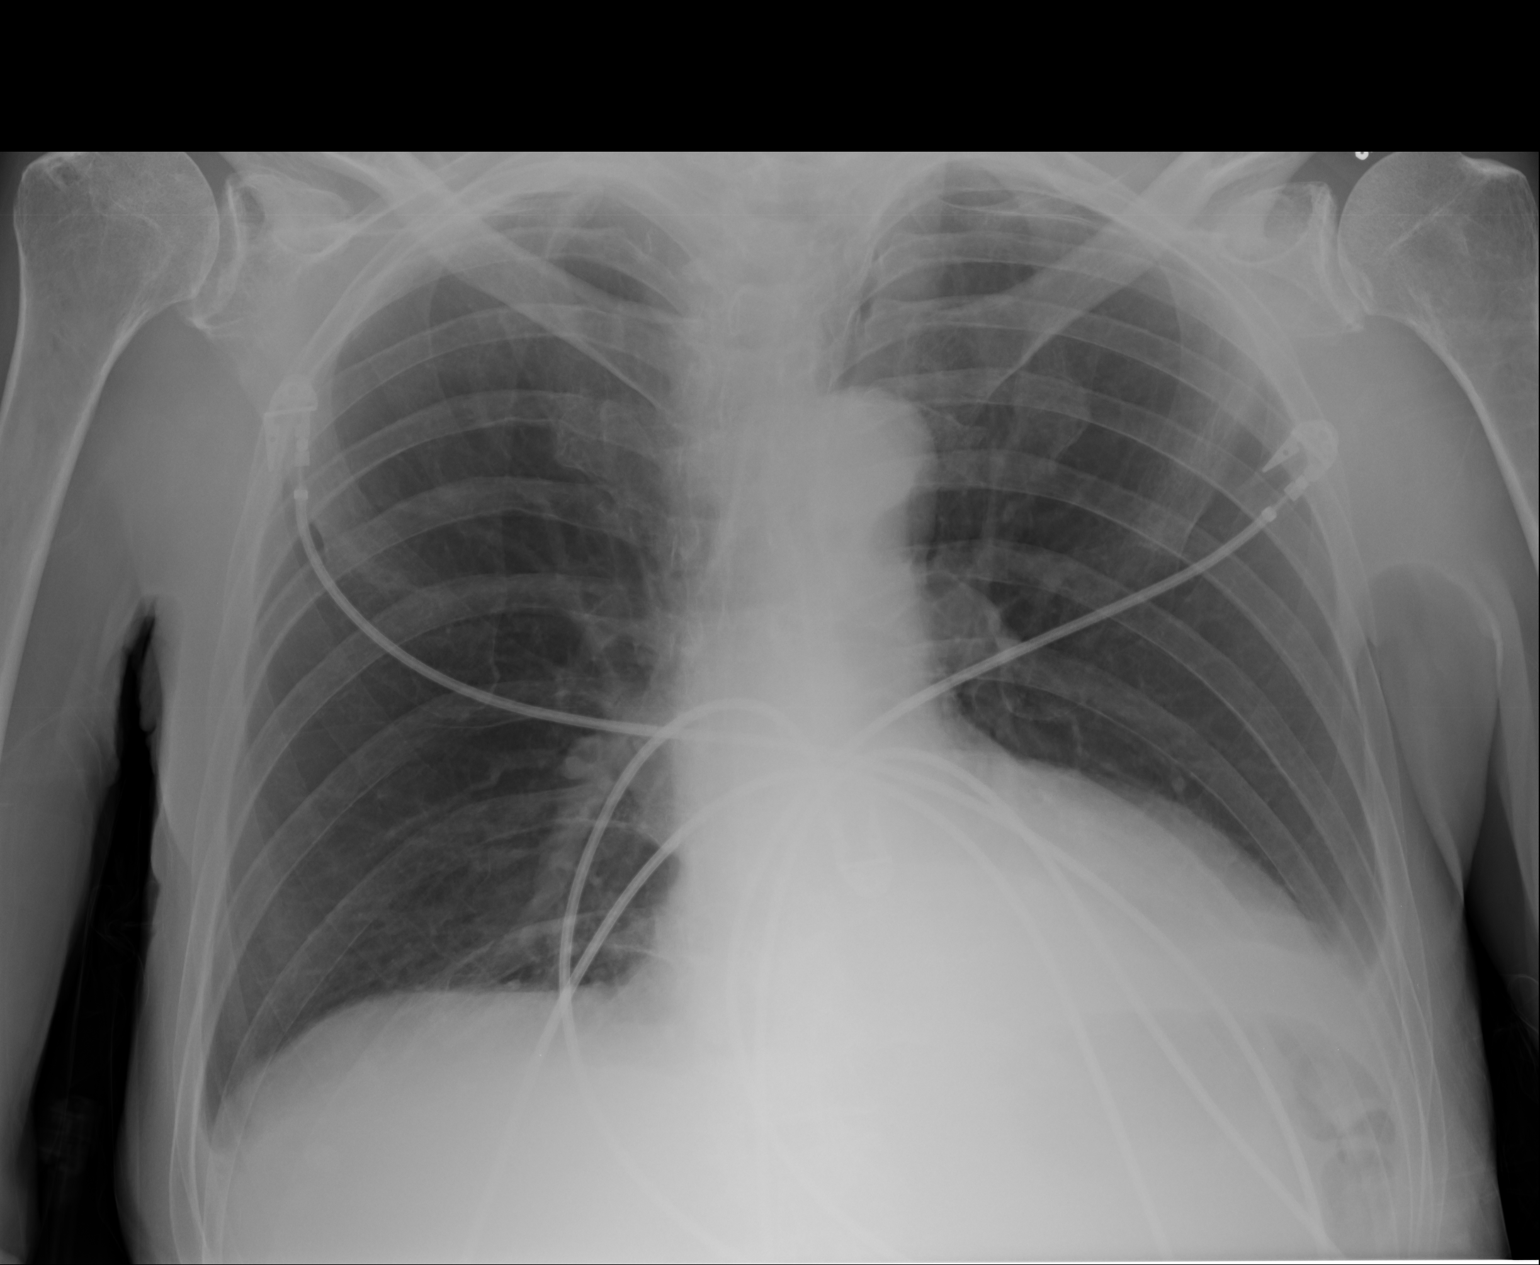

[1 of 1 positions shown; findings below may reference images not displayed]

FINDINGS: Cardiomegaly and mediastinum are is similar to prior given differences in
lung volumes. There is mild retrocardiac opacity and possible small left
pleural effusion.
IMPRESSION: 1. Mild retrocardiac opacity may represent atelectasis. Infection is not
excluded. Followup PA and lateral chest radiographs are recommended.
2. Possible small left pleural effusion.

## 2013-03-05 IMAGING — CR DG LUMBAR SPINE 2-3V
1 series · 3 of 3 positions shown · non-contrast
Comparison: none

REASON FOR EXAM: back pain
COMMENTS:

PROCEDURE:     DXR - DXR LUMBAR SPINE AP AND LATERAL  - January 17, 2012  [DATE]
RESULT:

[Series 3: t lumbar spine lat · 0.14mm/px · 3 of 3 slices shown]
[im 1/3]
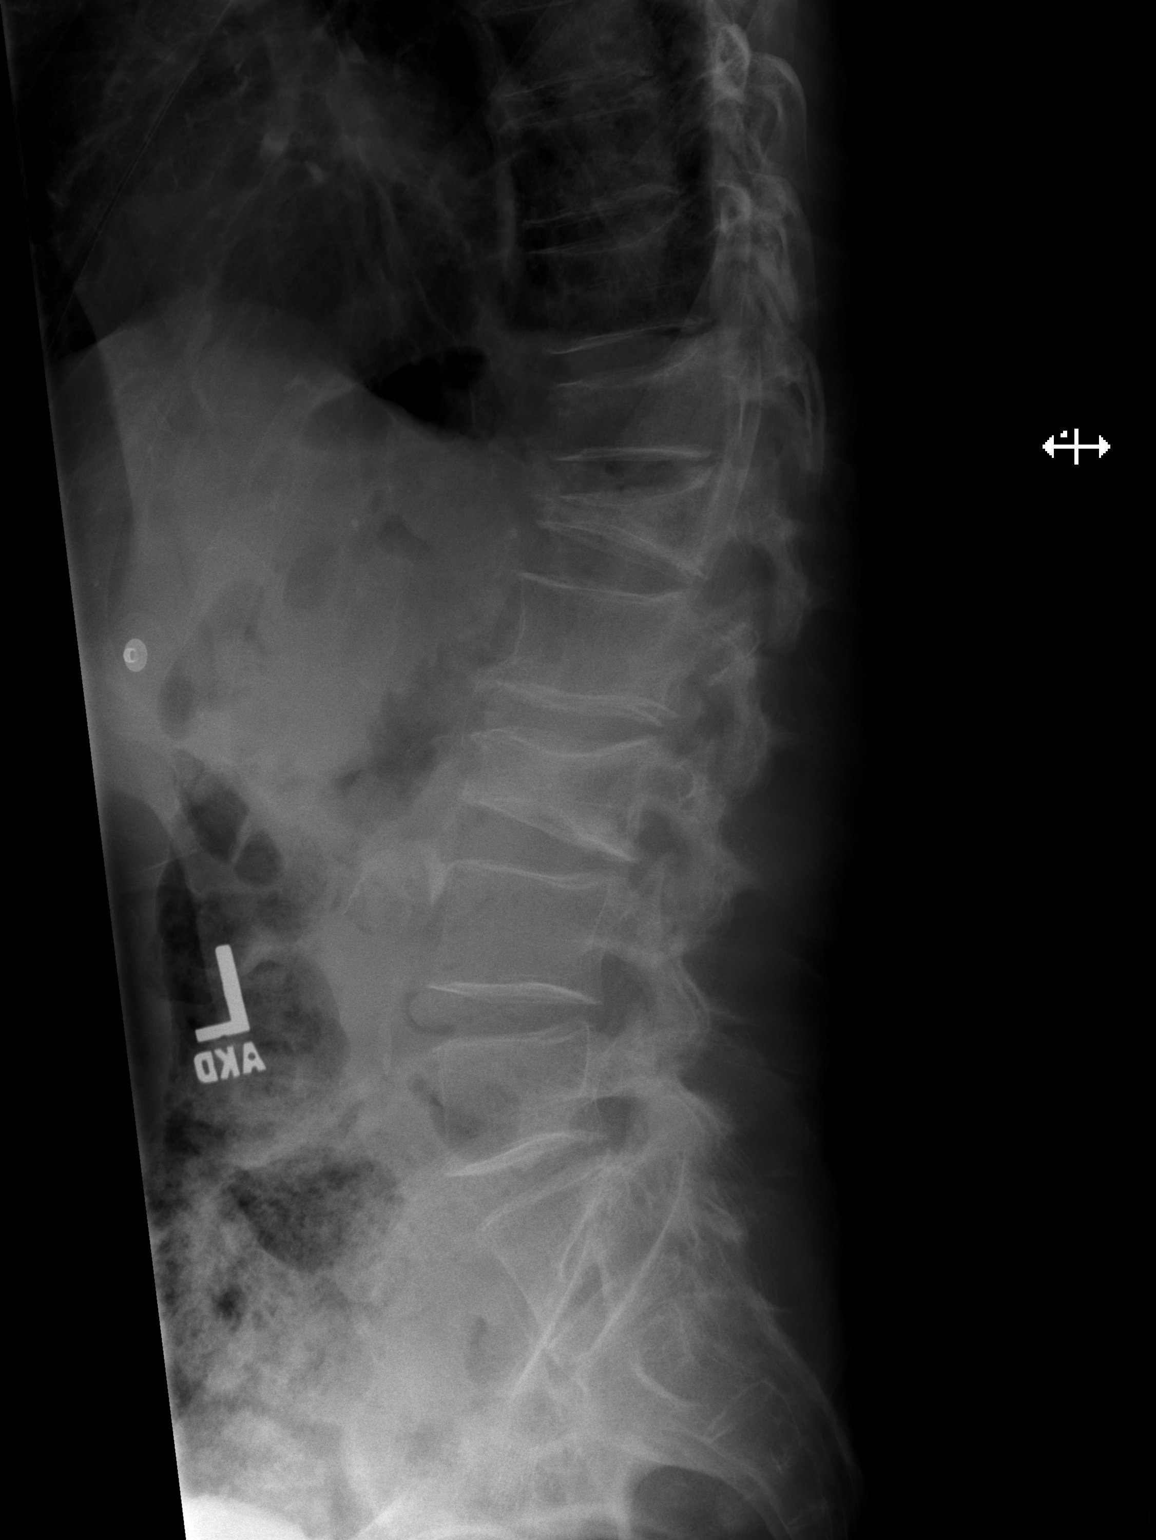
[im 2/3]
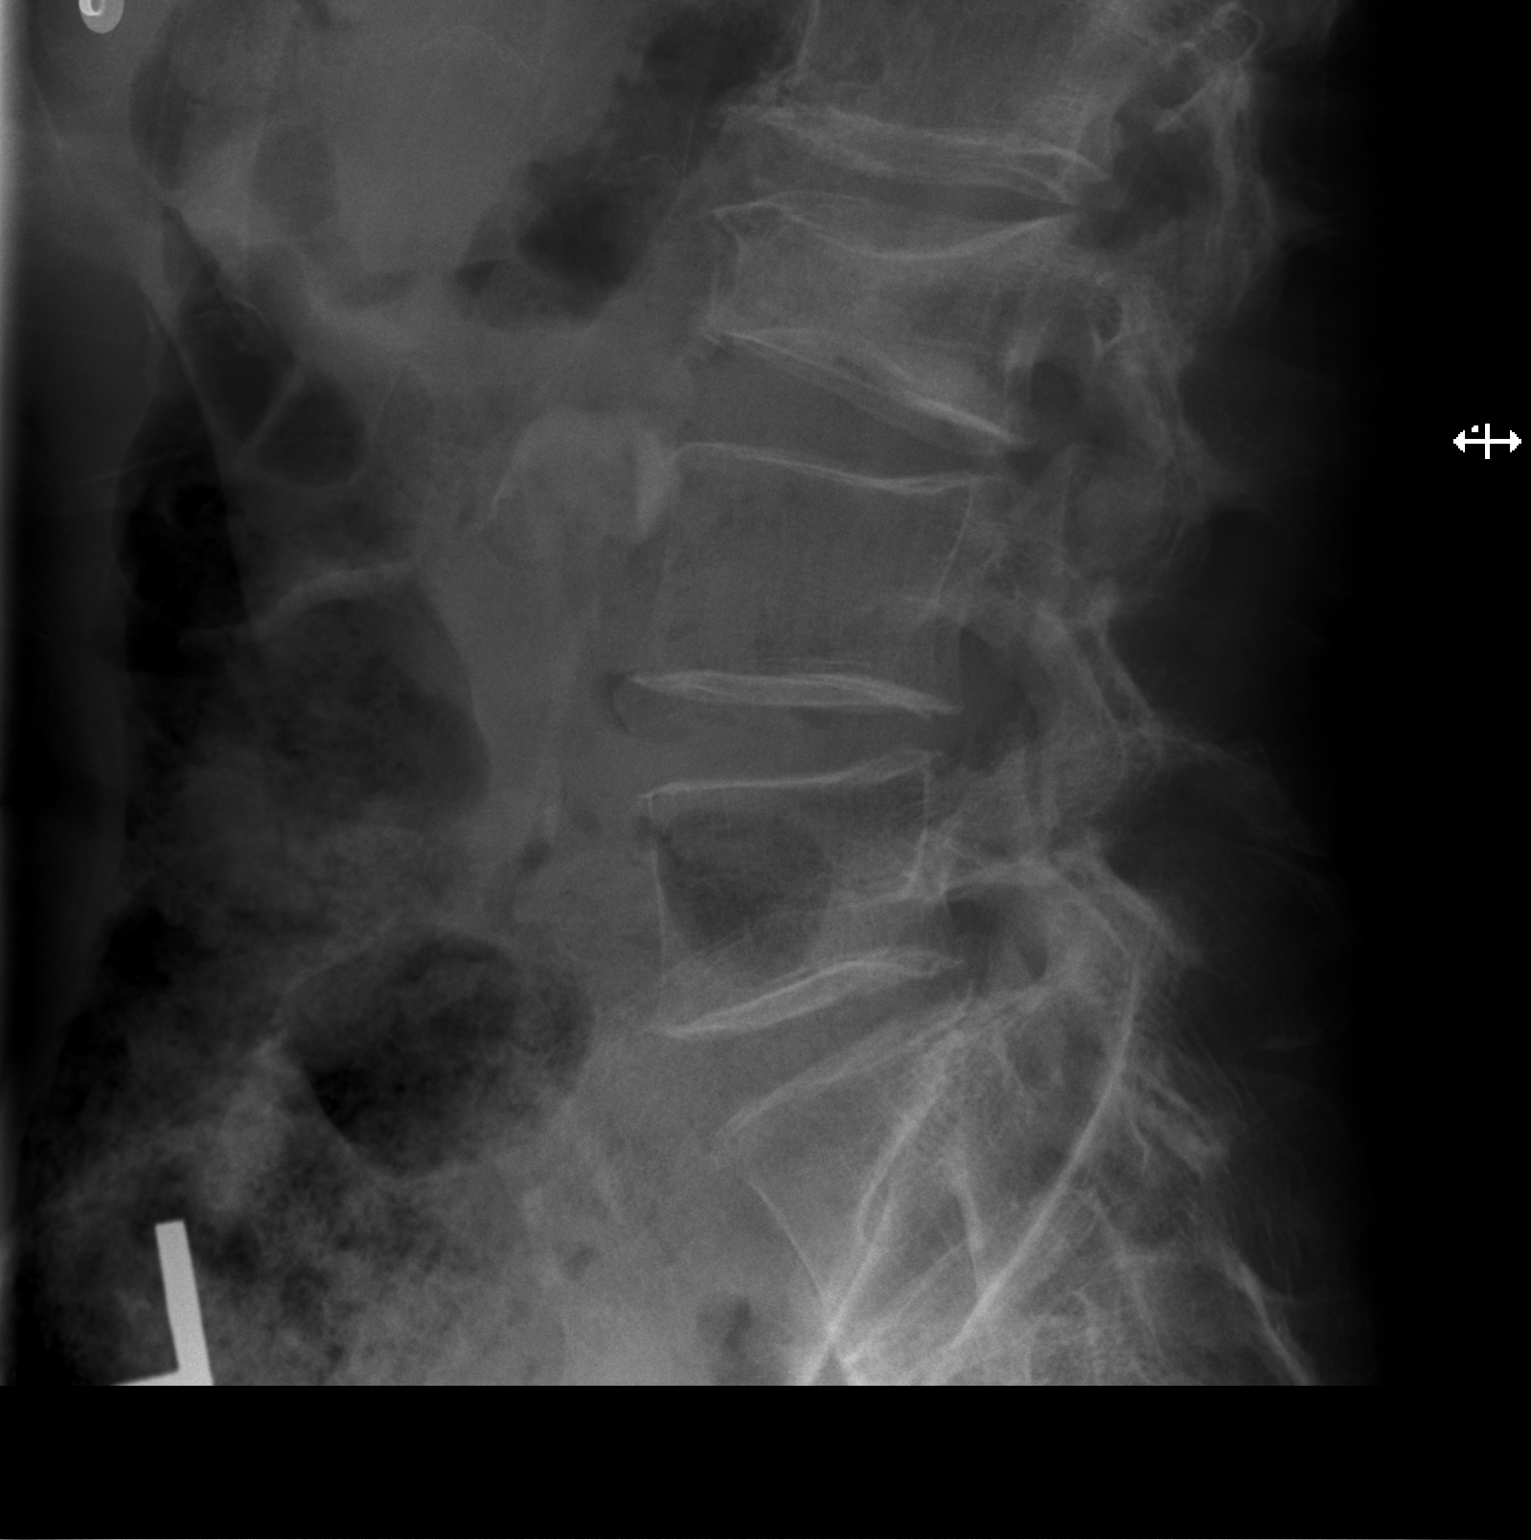
[im 3/3]
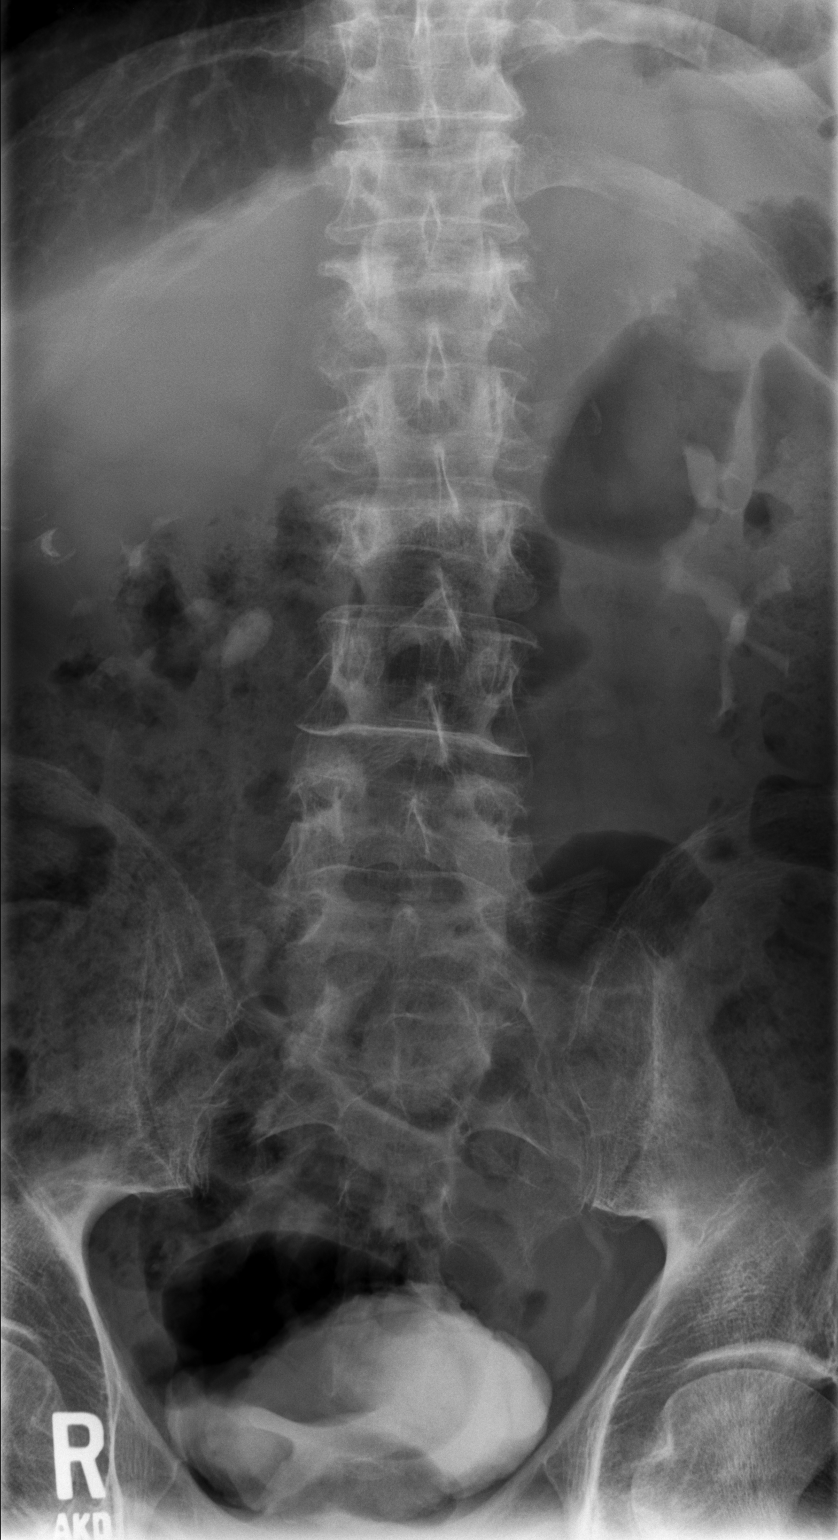

[3 of 3 positions shown; findings below may reference images not displayed]

FINDINGS: The bones are osteopenic. A transitional vertebral body is
identified at the S1-L6 level.

Compression deformity is appreciated involving T12, L1, and L3. The T12 and
L1 fracture appear to be chronic. The L3 fracture is indeterminate. The T12
fracture appears to be on the magnitude of 25% to 30%. The L1 fracture 70%
to 75% and the L3 fracture 30% to 35%. Multilevel degenerative changes are
appreciated evident by mild subchondral sclerosis, areas of osteophytosis as
well as facet sclerosis.
IMPRESSION: 1. Multilevel compression deformities. The T12 and L1 deformities appear to
be chronic. The L3 deformity is indeterminate.
2. Transitional vertebral body.

## 2013-04-22 ENCOUNTER — Emergency Department: Payer: Self-pay | Admitting: Emergency Medicine

## 2013-04-22 LAB — URINALYSIS, COMPLETE
Bilirubin,UR: NEGATIVE
Glucose,UR: NEGATIVE mg/dL (ref 0–75)
Hyaline Cast: 2
Ketone: NEGATIVE

## 2013-04-22 LAB — CBC WITH DIFFERENTIAL/PLATELET
Basophil #: 0 10*3/uL (ref 0.0–0.1)
Basophil %: 0.5 %
HCT: 37.4 % — ABNORMAL LOW (ref 40.0–52.0)
HGB: 13.2 g/dL (ref 13.0–18.0)
Lymphocyte #: 1.2 10*3/uL (ref 1.0–3.6)
Lymphocyte %: 12.5 %
MCHC: 35.3 g/dL (ref 32.0–36.0)
MCV: 97 fL (ref 80–100)
Monocyte #: 0.6 x10 3/mm (ref 0.2–1.0)
Monocyte %: 5.9 %
Neutrophil #: 7.8 10*3/uL — ABNORMAL HIGH (ref 1.4–6.5)
Neutrophil %: 80.1 %
WBC: 9.7 10*3/uL (ref 3.8–10.6)

## 2013-04-22 LAB — COMPREHENSIVE METABOLIC PANEL
Albumin: 3.1 g/dL — ABNORMAL LOW (ref 3.4–5.0)
Alkaline Phosphatase: 73 U/L (ref 50–136)
Anion Gap: 7 (ref 7–16)
BUN: 23 mg/dL — ABNORMAL HIGH (ref 7–18)
Bilirubin,Total: 0.5 mg/dL (ref 0.2–1.0)
Calcium, Total: 8.6 mg/dL (ref 8.5–10.1)
Chloride: 107 mmol/L (ref 98–107)
EGFR (African American): >60
Glucose: 143 mg/dL — ABNORMAL HIGH (ref 65–99)
Osmolality: 286 (ref 275–301)
SGOT(AST): 40 U/L — ABNORMAL HIGH (ref 15–37)
SGPT (ALT): 21 U/L (ref 12–78)

## 2013-04-22 LAB — TROPONIN I: Troponin-I: 0.04 ng/mL

## 2013-04-23 ENCOUNTER — Emergency Department: Payer: Self-pay | Admitting: Emergency Medicine

## 2013-04-23 LAB — COMPREHENSIVE METABOLIC PANEL
Albumin: 3.4 g/dL (ref 3.4–5.0)
Anion Gap: 6 — ABNORMAL LOW (ref 7–16)
BUN: 26 mg/dL — ABNORMAL HIGH (ref 7–18)
Bilirubin,Total: 0.6 mg/dL (ref 0.2–1.0)
Calcium, Total: 9 mg/dL (ref 8.5–10.1)
Osmolality: 293 (ref 275–301)
SGPT (ALT): 22 U/L (ref 12–78)

## 2013-04-23 LAB — CBC
HGB: 13.7 g/dL (ref 13.0–18.0)
MCV: 97 fL (ref 80–100)
RBC: 4.01 10*6/uL — ABNORMAL LOW (ref 4.40–5.90)

## 2013-04-23 LAB — URINE CULTURE

## 2013-11-12 ENCOUNTER — Inpatient Hospital Stay: Payer: Self-pay | Admitting: Family Medicine

## 2013-11-12 LAB — BASIC METABOLIC PANEL
Anion Gap: 6 — ABNORMAL LOW (ref 7–16)
BUN: 23 mg/dL — AB (ref 7–18)
CALCIUM: 8.5 mg/dL (ref 8.5–10.1)
CREATININE: 1.06 mg/dL (ref 0.60–1.30)
Chloride: 110 mmol/L — ABNORMAL HIGH (ref 98–107)
Co2: 25 mmol/L (ref 21–32)
GLUCOSE: 108 mg/dL — AB (ref 65–99)
Osmolality: 285 (ref 275–301)
POTASSIUM: 3.3 mmol/L — AB (ref 3.5–5.1)
Sodium: 141 mmol/L (ref 136–145)

## 2013-11-12 LAB — CBC WITH DIFFERENTIAL/PLATELET
BASOS ABS: 0 10*3/uL (ref 0.0–0.1)
Basophil %: 0.2 %
EOS PCT: 0.1 %
Eosinophil #: 0 10*3/uL (ref 0.0–0.7)
HCT: 34.9 % — AB (ref 40.0–52.0)
HGB: 11.8 g/dL — ABNORMAL LOW (ref 13.0–18.0)
LYMPHS ABS: 0.6 10*3/uL — AB (ref 1.0–3.6)
Lymphocyte %: 8.7 %
MCH: 33.5 pg (ref 26.0–34.0)
MCHC: 33.8 g/dL (ref 32.0–36.0)
MCV: 99 fL (ref 80–100)
Monocyte #: 0.2 x10 3/mm (ref 0.2–1.0)
Monocyte %: 3.7 %
NEUTROS ABS: 5.6 10*3/uL (ref 1.4–6.5)
Neutrophil %: 87.3 %
Platelet: 175 10*3/uL (ref 150–440)
RBC: 3.52 10*6/uL — ABNORMAL LOW (ref 4.40–5.90)
RDW: 13.9 % (ref 11.5–14.5)
WBC: 6.4 10*3/uL (ref 3.8–10.6)

## 2013-11-12 LAB — URINALYSIS, COMPLETE
BACTERIA: NONE SEEN
BLOOD: NEGATIVE
Bilirubin,UR: NEGATIVE
Glucose,UR: NEGATIVE mg/dL (ref 0–75)
Hyaline Cast: 1
Leukocyte Esterase: NEGATIVE
NITRITE: NEGATIVE
Ph: 5 (ref 4.5–8.0)
Protein: NEGATIVE
RBC,UR: 3 /HPF (ref 0–5)
Specific Gravity: 1.024 (ref 1.003–1.030)
Squamous Epithelial: <1
WBC UR: 1 /HPF (ref 0–5)

## 2013-11-12 LAB — TROPONIN I
TROPONIN-I: 0.12 ng/mL — AB
TROPONIN-I: 0.18 ng/mL — AB

## 2013-11-13 LAB — BASIC METABOLIC PANEL
ANION GAP: 7 (ref 7–16)
BUN: 21 mg/dL — ABNORMAL HIGH (ref 7–18)
CALCIUM: 8.6 mg/dL (ref 8.5–10.1)
Chloride: 110 mmol/L — ABNORMAL HIGH (ref 98–107)
Co2: 25 mmol/L (ref 21–32)
Creatinine: 0.88 mg/dL (ref 0.60–1.30)
EGFR (Non-African Amer.): >60
Glucose: 164 mg/dL — ABNORMAL HIGH (ref 65–99)
OSMOLALITY: 290 (ref 275–301)
Potassium: 4.3 mmol/L (ref 3.5–5.1)
SODIUM: 142 mmol/L (ref 136–145)

## 2013-11-13 LAB — CBC WITH DIFFERENTIAL/PLATELET
BASOS ABS: 0 10*3/uL (ref 0.0–0.1)
Basophil %: 0.3 %
EOS PCT: 0.2 %
Eosinophil #: 0 10*3/uL (ref 0.0–0.7)
HCT: 35 % — AB (ref 40.0–52.0)
HGB: 11.5 g/dL — ABNORMAL LOW (ref 13.0–18.0)
LYMPHS ABS: 0.8 10*3/uL — AB (ref 1.0–3.6)
Lymphocyte %: 14.4 %
MCH: 32.8 pg (ref 26.0–34.0)
MCHC: 32.8 g/dL (ref 32.0–36.0)
MCV: 100 fL (ref 80–100)
MONOS PCT: 6.3 %
Monocyte #: 0.4 x10 3/mm (ref 0.2–1.0)
NEUTROS PCT: 78.8 %
Neutrophil #: 4.4 10*3/uL (ref 1.4–6.5)
Platelet: 179 10*3/uL (ref 150–440)
RBC: 3.5 10*6/uL — ABNORMAL LOW (ref 4.40–5.90)
RDW: 13.6 % (ref 11.5–14.5)
WBC: 5.6 10*3/uL (ref 3.8–10.6)

## 2013-11-14 LAB — CBC WITH DIFFERENTIAL/PLATELET
BASOS ABS: 0 10*3/uL (ref 0.0–0.1)
Basophil %: 0.4 %
EOS PCT: 0.1 %
Eosinophil #: 0 10*3/uL (ref 0.0–0.7)
HCT: 33.3 % — ABNORMAL LOW (ref 40.0–52.0)
HGB: 11.2 g/dL — AB (ref 13.0–18.0)
Lymphocyte #: 1.2 10*3/uL (ref 1.0–3.6)
Lymphocyte %: 20.4 %
MCH: 33.5 pg (ref 26.0–34.0)
MCHC: 33.7 g/dL (ref 32.0–36.0)
MCV: 99 fL (ref 80–100)
MONO ABS: 0.3 x10 3/mm (ref 0.2–1.0)
Monocyte %: 5.5 %
NEUTROS PCT: 73.6 %
Neutrophil #: 4.2 10*3/uL (ref 1.4–6.5)
PLATELETS: 173 10*3/uL (ref 150–440)
RBC: 3.35 10*6/uL — ABNORMAL LOW (ref 4.40–5.90)
RDW: 13.7 % (ref 11.5–14.5)
WBC: 5.7 10*3/uL (ref 3.8–10.6)

## 2013-11-14 LAB — BASIC METABOLIC PANEL
Anion Gap: 10 (ref 7–16)
BUN: 13 mg/dL (ref 7–18)
CALCIUM: 8.5 mg/dL (ref 8.5–10.1)
CHLORIDE: 107 mmol/L (ref 98–107)
CO2: 23 mmol/L (ref 21–32)
Creatinine: 0.75 mg/dL (ref 0.60–1.30)
EGFR (African American): >60
EGFR (Non-African Amer.): >60
Glucose: 207 mg/dL — ABNORMAL HIGH (ref 65–99)
Osmolality: 286 (ref 275–301)
Potassium: 3.8 mmol/L (ref 3.5–5.1)
SODIUM: 140 mmol/L (ref 136–145)

## 2013-11-14 LAB — URINE CULTURE

## 2013-11-15 LAB — BASIC METABOLIC PANEL
Anion Gap: 7 (ref 7–16)
BUN: 12 mg/dL (ref 7–18)
CALCIUM: 8.3 mg/dL — AB (ref 8.5–10.1)
CHLORIDE: 108 mmol/L — AB (ref 98–107)
Co2: 27 mmol/L (ref 21–32)
Creatinine: 0.55 mg/dL — ABNORMAL LOW (ref 0.60–1.30)
Glucose: 175 mg/dL — ABNORMAL HIGH (ref 65–99)
OSMOLALITY: 287 (ref 275–301)
POTASSIUM: 3.9 mmol/L (ref 3.5–5.1)
Sodium: 142 mmol/L (ref 136–145)

## 2013-11-16 LAB — BASIC METABOLIC PANEL
ANION GAP: 6 — AB (ref 7–16)
BUN: 11 mg/dL (ref 7–18)
CREATININE: 0.81 mg/dL (ref 0.60–1.30)
Calcium, Total: 8.7 mg/dL (ref 8.5–10.1)
Chloride: 110 mmol/L — ABNORMAL HIGH (ref 98–107)
Co2: 27 mmol/L (ref 21–32)
EGFR (Non-African Amer.): >60
Glucose: 198 mg/dL — ABNORMAL HIGH (ref 65–99)
OSMOLALITY: 290 (ref 275–301)
Potassium: 4.3 mmol/L (ref 3.5–5.1)
SODIUM: 143 mmol/L (ref 136–145)

## 2013-11-17 LAB — CULTURE, BLOOD (SINGLE)

## 2014-04-11 ENCOUNTER — Emergency Department: Payer: Self-pay | Admitting: Student

## 2014-04-11 LAB — TROPONIN I: Troponin-I: <0.02 ng/mL

## 2014-04-11 LAB — CBC WITH DIFFERENTIAL/PLATELET
Basophil #: 0 10*3/uL (ref 0.0–0.1)
Basophil %: 0.4 %
EOS PCT: 1.3 %
Eosinophil #: 0.1 10*3/uL (ref 0.0–0.7)
HCT: 41.8 % (ref 40.0–52.0)
HGB: 14 g/dL (ref 13.0–18.0)
LYMPHS ABS: 0.8 10*3/uL — AB (ref 1.0–3.6)
Lymphocyte %: 12.1 %
MCH: 33.2 pg (ref 26.0–34.0)
MCHC: 33.4 g/dL (ref 32.0–36.0)
MCV: 99 fL (ref 80–100)
MONOS PCT: 4.3 %
Monocyte #: 0.3 x10 3/mm (ref 0.2–1.0)
Neutrophil #: 5.7 10*3/uL (ref 1.4–6.5)
Neutrophil %: 81.9 %
Platelet: 226 10*3/uL (ref 150–440)
RBC: 4.21 10*6/uL — ABNORMAL LOW (ref 4.40–5.90)
RDW: 14.8 % — AB (ref 11.5–14.5)
WBC: 6.9 10*3/uL (ref 3.8–10.6)

## 2014-04-11 LAB — BASIC METABOLIC PANEL
Anion Gap: 4 — ABNORMAL LOW (ref 7–16)
BUN: 13 mg/dL (ref 7–18)
CALCIUM: 8.8 mg/dL (ref 8.5–10.1)
CHLORIDE: 106 mmol/L (ref 98–107)
Co2: 28 mmol/L (ref 21–32)
Creatinine: 0.68 mg/dL (ref 0.60–1.30)
EGFR (African American): >60
EGFR (Non-African Amer.): >60
GLUCOSE: 70 mg/dL (ref 65–99)
Osmolality: 274 (ref 275–301)
Potassium: 3.8 mmol/L (ref 3.5–5.1)
SODIUM: 138 mmol/L (ref 136–145)

## 2014-06-05 DEATH — deceased

## 2014-10-23 NOTE — Discharge Summary (Signed)
PATIENT NAME:  Ryan Wall, Ryan Wall MR#:  [id number] DATE OF BIRTH:  April 21, 1930  DATE OF ADMISSION:  02/18/2012 DATE OF DISCHARGE:  03/02/2012  DISCHARGE DIAGNOSES:  1. Bradycardia status post pacemaker placement 02/25/2012.  2. Type 1 diabetes.  3. Hyperlipidemia.  4. Hypertension.  5. Gastroesophageal reflux disease.  6. Peripheral arterial disease.  7. Moderate dementia.  8. History of transient ischemic attack.   DISCHARGE MEDICATIONS:  1. Tylenol 650 Wall.o. q.6h. Wall.r.n. for pain and fever.  2. Amlodipine 10 mg Wall.o. daily.  3. Plavix 75 mg Wall.o. daily.  4. Colace 100 mg Wall.o. b.i.d. Wall.r.n. for constipation.  5. Donepezil 10 mg Wall.o. at bedtime.  6. Lovastatin 20 mg Wall.o. at bedtime.  7. Detrol 4 mg Wall.o. daily.  8. Ultram 40 mg Wall.o. q.8 hours Wall.r.n. for pain.  9. Lisinopril 40 mg Wall.o. daily.  10. NovoLog 6 units subcutaneous t.i.d. with meals.  11. Lantus 9 units subcutaneous at bedtime.  12. NovoLog sliding scale insulin coverage 1 unit fasting blood glucose 1501 to 200, 4 units if 201 to 250, 6 units if 251 to 300, 8 units if 301 to 350, 10 units if 351 to 400, call M.D. if greater than 400.   CONSULTS:  1. Cardiology per Dr. Gwen PoundsKowalski. 2. Endocrinology per Dr. Tedd Siassolum.   PROCEDURE: Pacemaker placement 02/25/2012.   LABORATORY, DIAGNOSTIC AND RADIOLOGICAL DATA: A1c of 8.9 prior to discharge, sodium 140, potassium 3.7, glucose less than 200 throughout the last 24 hours.   HOSPITAL COURSE:  1. Bradycardia. Patient initially came in with heart rates between the 20s and 50s. Was cautious to ambulate patient because of such low blood heart rate. Was seen by cardiology, underwent a pacemaker placement. Heart rate has remained stable without complications at the site of the pacemaker. Will avoid beta blockers at this time. Will continue other regimens. Will follow up with Dr. Gwen PoundsKowalski in one week.  2. Type 1 diabetes. Patient has brittle diabetes. He had a hypoglycemic episode during his  hospital stay. Endocrinology was consulted. Dr. Tedd SiasSolum saw the patient, placed him on sliding scale as well as NovoLog meal coverage and Lantus 9 units at bedtime and his blood sugars have remained stable since that time, less than 200. Will continue on that regimen as outpatient. Will follow up with Dr. Tedd SiasSolum in 2 to 4 weeks.  3. Other chronic medical issues remained stable. Will continue with regimen as described above.   DISPOSITION: Stable condition to be discharged to [facility] for further rehab for physical therapy. He is from [facility], may be transferred there once rehab is completed.   ____________________________ Ryan IvanKanhka Oseias Horsey, MD kl:cms D: 03/02/2012 08:33:07 ET T: 03/02/2012 09:09:19 ET JOB#: [id number]  cc: Ryan IvanKanhka Harvest Stanco, MD, <Dictator> Ryan IvanKANHKA Nylan Nevel MD ELECTRONICALLY SIGNED 03/09/2012 11:06

## 2014-10-23 NOTE — Consult Note (Signed)
Chief Complaint and History:   Referring Physician Dr. Burnadette PopLinthavong    Chief Complaint uncontrolled diabetes   Allergies:  No Known Allergies:   Assessment/Plan:   Assessment/Plan 81 yo M with type 1 diabetes well known to me from clinic, admitted with hyperglycemia and bradycardia on 8/15. He has a long history of uncontrolled diabetes which has worsened dramatically this last year as his dementia worsened and he could no longer care for himself. We have tried different methods of getting him the insulin he needs -- initially his wife helped but this was not successful, they hired an Insurance account managerpart-time aide who then quit, and then he moved from [place] to [facility] where they could not provide him with a sliding scale insulin so sugars continued to remain erratic. Most recently, he has been at [facility] where it appears his regimen was changed from Lantus 12 units qHS and NovoLog 10 tid plus SSI to NovoLog 75/25 Mix 11 units twice daily with breakfast and supper and then NovoLog 7 units at lunch. On admission here, Lantus and NovoLog were restarted but last night, the Lantus was held. Today sugars have been 128-311 and he is on Regular insulin SSI only.  A/ Uncontrolled type 1 diabetes Bradycardia s/p pacemaker Dementia  P/ DC metformin Restart Lantus - give 9 units qHS DC Regular insulin Start NovoLog scheduled at meals: 6 units qAC Start NovoLog SSI (modified) and give at meals only, not bedtime  Full consult to be dictated.   Electronic Signatures: Raj JanusSolum, Evelena Masci M (MD)  (Signed 26-Aug-13 15:46)  Authored: Chief Complaint and History, ALLERGIES, HOME MEDICATIONS, Assessment/Plan   Last Updated: 26-Aug-13 15:46 by Raj JanusSolum, Zael Shuman M (MD)

## 2014-10-23 NOTE — H&P (Signed)
PATIENT NAME:  Ryan Wall, Leviticus P MR#:  [id number] DATE OF BIRTH:  09/24/1929  DATE OF ADMISSION:  04/03/2012  PRIMARY CARE PHYSICIAN: Dr. Sampson GoonFitzgerald     REFERRING PHYSICIAN: Dr. Clemens Catholicagsdale   CHIEF COMPLAINT: Altered mental status today.   HISTORY OF PRESENT ILLNESS: The patient is an 81 year old Caucasian male with a history of CVA, hypertension, diabetes, and Alzheimer's dementia who was brought to the ED from nursing home due to altered mental status. The patient had a low blood sugar at 35 last night and was treated with D50 by EMS. However, the patient's blood sugar increased to 390 today. He became sluggish and not at his baseline so he was sent to the ED for further evaluation. ED physician evaluated the patient and admitted the patient for altered mental status. The patient is alert and awake but was not able to provide any detailed information. He denied any symptoms.   PAST MEDICAL HISTORY:  1. Hypertension.  2. Diabetes.  3. Alzheimer's dementia. 4. CVA. 5. Chronic back pain. 6. Benign prostatic hypertrophy.   PAST SURGICAL HISTORY: Appendectomy.   FAMILY HISTORY: Diabetes, hypertension, CVA.  REVIEW OF SYSTEMS: The patient denies any symptoms. Unable to obtain due to the patient's underlying dementia   ALLERGIES: None.    HOME MEDICATIONS:  1. Ultram 40 mg p.o. every eight hours p.r.n.  2. Tylenol Arthritis caplet 1 tablet every six hours p.r.n.  3. Plavix 75 mg p.o. daily. 4. NovoLog sliding scale. 5. NovoLog 6 units sub-Q a.c. meal. 6. Lovastatin 20 mg p.o. at 9 a.m.  7. Lisinopril 40 mg p.o. daily. 8. Lantus 9 units sub-Q once daily at bedtime. 9. Donepezil 10 mg one cap once daily.  10. Detrol LA 4 mg p.o. daily.  11. Colace 100 mg p.o. b.i.d.  12. Norvasc 10 mg p.o. daily   PHYSICAL EXAMINATION:   VITALS: Temperature 97.1, blood pressure 121/65, pulse 65, oxygen saturation 96% on room air.   GENERAL: The patient is alert, awake, and oriented x2 in no acute  distress.   HEENT: Pupils round, equal, and reactive to light and accommodation. Moist oral mucosa. Clear oropharynx.   NECK: Supple. No JVD or carotid bruit. No lymphadenopathy. No thyromegaly.   CARDIOVASCULAR: S1, S2 regular rate and rhythm. No murmurs or gallops.   PULMONARY: Bilateral air entry. No wheezing or rales. No use of accessory muscles to breathe.   ABDOMEN: Soft. No distention or tenderness. No organomegaly. Bowel sounds present.   EXTREMITIES: Bilateral leg edema 1+. No clubbing or cyanosis. No calf tenderness. Strong bilateral pulses.   SKIN: No rash or jaundice.   NEUROLOGIC: The patient is alert, awake, oriented. Follows commands but looks demented. No focal deficit. Power 5 out of 5. Sensation intact. Deep tendon reflexes mute.   LABORATORY, DIAGNOSTIC, AND RADIOLOGICAL DATA: CAT scan of the head showed no acute intracranial process.   Chest x-ray showed hyperinflated lung likely secondary to COPD.   WBC 7.3, hemoglobin 13.2, platelets 200, blood sugar 391, BUN 12, creatinine 0.85. Electrolytes normal. CK 154. CK-MB 4.9. Troponin 0.04.   EKG showed electronic ventricular pacemaker at 67 beats per minute.   IMPRESSION:  1. Altered mental status, unknown etiology.  2. Hypertension.  3. Diabetes.  4. Dementia. 5. CVA.  6. Benign prostatic hypertrophy.  PLAN OF TREATMENT:  1. The patient will be placed for observation.  2. We will start fall and aspiration precautions. 3. Follow urinalysis to rule out urinary tract infection.  4. For diabetes, we  will start sliding scale and continue Lantus but hold NovoLog a.c. meal.  5. We will continue hypertension medication.  6. Continue Plavix.  7. GI and DVT prophylaxis.   TIME SPENT: About 55 minutes.   ____________________________ Shaune Pollack, MD qc:drc D: 04/03/2012 18:27:42 ET T: 04/04/2012 05:56:19 ET JOB#: [id number]  cc: Shaune Pollack, MD, <Dictator> Stann Mainland. Sampson Goon, MD Shaune Pollack MD ELECTRONICALLY SIGNED  04/04/2012 14:55

## 2014-10-23 NOTE — H&P (Signed)
PATIENT NAME:  Ryan Wall, Ryan P MR#:  [id number] DATE OF BIRTH:  1929-09-06  DATE OF ADMISSION:  02/18/2012  PRIMARY CARE PHYSICIAN:  Dr. Burnadette PopLinthavong    CHIEF COMPLAINT: Hyperglycemia and bradycardia.   HISTORY OF PRESENT ILLNESS: 81 year old nursing home resident male presents to the ED from nursing home due to the complaint of hyperglycemia and bradycardia. Per ED attending, reportedly the patient is usually not bradycardic and due to this new onset of bradycardia as well as hyperglycemia he was sent to the Emergency Department for evaluation. The hyperglycemia persisted despite having received his usual doses of insulin. At the time of evaluation in the Emergency Department his blood sugar was noted to be 320.   His heart rate has been bradycardic in the Emergency Department ranging between 40s and 50s. However, there were intermittent episodes when he dropped his heart rate to the 30s and due to this we have been called to evaluate him and monitor for possible heart block.   On evaluation and discussion with the patient, please note the patient does have history of dementia. However, he denies any dizziness, lightheadedness symptoms or syncopal episodes. He denies any palpitations.   PAST MEDICAL HISTORY:  1. Alzheimer's dementia.  2. Cerebrovascular accident.  3. Hyperlipidemia.  4. Hypertension. 5. Insulin-dependent diabetes mellitus.  6. Chronic low back pain.  7. Benign prostatic hypertrophy.    PAST SURGICAL HISTORY: Appendectomy.   MEDICATIONS:  Per nursing home records:  1. Novolin R 6 units with meal.  2. Lantus 14 units nightly.  3. Donepezil 10 mg nightly.  4. Amlodipine 10 mg daily.  5. Clopidogrel 75 mg daily.  6. Detrol LA 4 mg daily.  7. Tamsulosin 0.4 mg, 2 capsules daily.  8. Lisinopril 20 mg daily.  9. Acetaminophen extra 500-mg tablets 3 times daily.  10. Docusate 100 mg twice daily.  11. Lovastatin 20 mg nightly.  12. Tramadol 50 mg every six hours as needed.    ALLERGIES: No known drug allergies.   SOCIAL HISTORY: Remote tobacco use. No history of alcohol per records. The patient is a resident at [facility] nursing home.   FAMILY HISTORY: Per records, notable for diabetes, hypertension, and cerebrovascular accident.   REVIEW OF SYSTEMS: Unable to obtain due to the patient's underlying dementia.    PHYSICAL EXAMINATION: VITAL SIGNS:  Temperature 95.7, pulse initially 51, respirations 22. Blood pressure 190/85 on presentation. At the time of my evaluation it was 175/76. Saturating 96% on room air.   GENERAL: The patient is in no distress. He was resting comfortably and sleeping. While he was sleeping his heart rate was about 35 and when I woke him it gradually rose to the 60s.   HEENT: Normocephalic, atraumatic. Extraocular muscles are intact. Pink conjunctivae. Normal external ears and nares. Moist mucous membranes. No oral lesions. Posterior oropharynx is clear   NECK: Supple. No lymphadenopathy.  CHEST: Clear to auscultation bilaterally.   CARDIAC: S1, S2. Regular rate and rhythm.   ABDOMEN: Soft, nontender, nondistended. Normal bowel sounds.   EXTREMITIES: 2+ edema bilateral lower extremities.   LYMPH: No lymphadenopathy appreciated.   LABORATORY DATA: WBC count 4.7, hemoglobin 13.6, hematocrit 39.8, platelets of 172 with MCV of 100. Glucose 320, BUN 20, creatinine 0.77, sodium 142, potassium 3.5, chloride 105, bicarbonate 27, calcium is 8.9, bilirubin 0.4, alkaline phosphatase is 127, ALT 22, AST 21, total protein 7.1, albumin is 3.4, osmolality 298 with a GFR greater than 60. Anion gap 10. Troponin is less than 0.02. CK  is 93 and CK-MB is 4.1. Urinalysis is negative for nitrites and leukocyte esterase, 1 RBC, 1WBC.  Point of care blood glucose 265.   Initial EKG shows sinus bradycardia with first-degree AV block with frequent premature ventricular complexes. Repeat EKG shows sinus bradycardia at 49 beats per minute.   ASSESSMENT AND  PLAN:  81 year old gentleman with diabetes presenting with hyperglycemia and bradycardia.  1. Hyperglycemia as a result of uncontrolled diabetes mellitus: The patient has received 10 units of subcutaneous insulin in the Emergency Department. We will resume his home regimen while he is here with Lantus 14 units nightly and  preprandial insulin.  We will continue carbohydrate-controlled diet.  2. Bradycardia: The patient seems to be asymptomatic with his bradycardia.  He probably does become more bradycardic while he sleeps. At this time we will monitor on telemetry, check magnesium given the premature ventricular complexes noted on prior EKG. If asymptomatic, most likely monitor in the outpatient setting. He is not on any AV nodal-blocking agents at this time.  3. Hypertension: We will continue his antihypertensives at this time. He is on lisinopril and amlodipine.  4. Benign prostatic hypertrophy: We will continue his tamsulosin and Detrol.  5. Hyperlipidemia: Continue lovastatin.  6. Chronic back pain: Continue tramadol and acetaminophen as needed.  7. Prophylaxis. Lovenox.   DISPOSITION: Likely 24 to 48 hours' monitoring for uncontrolled diabetes and bradycardia.   CODE STATUS: FULL CODE.      TIME SPENT: 35 minutes.   ____________________________ Aurther Loft, DO aeo:bjt D: 02/18/2012 06:06:01 ET T: 02/18/2012 07:29:01 ET JOB#: [id number]  cc: Aurther Loft, DO, <Dictator> Marisue Ivan, MD Ross Bender E Blakeleigh Domek DO ELECTRONICALLY SIGNED 02/19/2012 3:10

## 2014-10-23 NOTE — Consult Note (Signed)
Pt without abd pain, tol diet, I will sign off, reconsuolt if needed.  Electronic Signatures: Scot JunElliott, Rhina Kramme T (MD)  (Signed on 17-Jul-13 14:17)  Authored  Last Updated: 17-Jul-13 14:17 by Scot JunElliott, Junnie Loschiavo T (MD)

## 2014-10-23 NOTE — Consult Note (Signed)
PATIENT NAME:  Ryan Wall, Ryan Wall MR#:  [id number] DATE OF BIRTH:  1930/03/05  DATE OF CONSULTATION:  04/05/2012  REFERRING PHYSICIAN:  Stann Mainland. Sampson Goon, MD  CONSULTING PHYSICIAN:  A. Wendall Mola, MD  CHIEF COMPLAINT: Uncontrolled diabetes.   HISTORY OF PRESENT ILLNESS: The patient is an 81 year old male with type 1 diabetes admitted on 29 September for mental status changes associated with wide fluctuations in blood sugars. He has been residing at [facility], a local nursing facility, since 03/31/2012. I was on call this past weekend and contacted on 04/03/2012 with reports of high blood sugars in the 390s. I had recommended they give NovoLog 30, but I am not sure that was done. It seems he presented to the Emergency Department  later that afternoon with reports of change in his mental status, and his blood sugar at that time was found to be 392. His MAR from [facility] was reviewed. He is receiving Lantus 9 units at bedtime and NovoLog 6 units t.i.d. a.c. in addition to a NovoLog sliding scale. This is a change from his prior Lantus dose of 12 units. The patient is well known to me, and I last saw him in clinic on 03/24/2012. At that time, he was at rehab at [facility]. The patient has chronic dementia and is a poor historian. He was seen alone in his room. He denies any complaints.   PAST MEDICAL HISTORY:  1. Type 1 diabetes. 2. Hypertension.  3. Hyperlipidemia.  4. Gastroesophageal reflux disease.  5. Dementia.  6. Peripheral arterial disease.  7. Multinodular goiter.  8. Right inguinal hernia.  9. History of transient ischemic attack.   INPATIENT MEDICATIONS:  1. Lantus 9 units at bedtime.  2. Amlodipine 10 mg daily.  3. Colace 100 mg b.i.d.  4. Aricept 10 mg daily.  5. Lisinopril 40 mg daily.  6. Lovastatin 20 mg daily.  7. Pantoprazole 40 mg daily.  8. Detrol 4 mg daily.  9. Plavix 75 mg daily.  10. Lovenox 40 mg daily.  11. NovoLog sliding scale.  12. Normal  saline 0.9%, 60 mL/h.   SOCIAL HISTORY: The patient has been residing at [facility] since 03/31/2012, prior to that was at [facility] for rehab. No tobacco or alcohol use. He is married and his wife resides at [facility]. He has at least two sons and one of his sons is his Power of Pensions consultant.    FAMILY HISTORY: A brother had a history of thyroid cancer. Family history is also positive for diabetes and hypertension.   ALLERGIES: Lipitor and Altace.   REVIEW OF SYSTEMS: HEENT: He denies vision changes. No headache. NECK: Denies dysphagia. No neck pain. CARDIAC: Denies chest pain or palpitations. PULMONARY: Denies cough or shortness of breath. ABDOMEN: Denies abdominal pain, diarrhea or constipation. Reports good appetite. EXTREMITIES: Denies leg swelling. SKIN: Denies rash or pruritus. HEMATOLOGIC: Denies easy bruisability or recent bleeding. NEUROLOGIC: Denies recent falls or dizziness.   PHYSICAL EXAMINATION:  VITAL SIGNS: Height 75 inches, weight 155 pounds, BMI 19.4. Temperature 97.8, pulse 62, respirations 18, blood pressure 139/89, pulse oximetry 97% on room air.   GENERAL: A well-appearing white male in no acute distress.   HEENT: Extraocular movements are intact. Oropharynx is clear. Mucous membranes moist.   NECK: Supple.   LYMPH: No submandibular, anterior, or cervical lymphadenopathy.   CARDIAC: Regular rate and rhythm without murmur.   PULMONARY: Clear to auscultation bilaterally. No wheeze.  ABDOMEN: Diffusely soft, nontender, nondistended.   EXTREMITIES:  No edema is present.   SKIN: There is are pretibial erythema bilaterally, symmetric and likely chronic in nature. No rash is present.   NEUROLOGIC: Nonfocal.   PSYCHIATRIC: Alert, oriented to person and place, not time.   LABORATORY, DIAGNOSTIC AND RADIOLOGICAL DATA: Blood sugars in the last 24 hours have been in the range of 119-356. No new labs today. 04/03/2012: Glucose 392, BUN 12, creatinine 0.85, sodium  140, potassium 4.2. Hemoglobin A1c 7.9%. Albumin 3.4, AST 22, ALT 21, troponin I 0.04, hematocrit 38.3%, platelets 200. WBC 7.3.   ASSESSMENT: This is an 81 year old male with long-standing type 1 diabetes, presenting with fluctuations in mental status associated with variations in blood sugars.   RECOMMENDATIONS: The patient previously had fairly good blood sugar control on Lantus 12 units at bedtime. There has long been difficulty in getting consistency in his insulin dosing as he is transitioned from various nursing homes to rehab facility to hospitalization and so forth. I feel his blood sugars could be in good control if we could have consistency in his insulin dosing and consistency in meals. I will resume his Lantus, at just 11 units at bedtime, given this recent concern of hypoglycemia. I will resume his NovoLog at 6 units before meals, and I will resume his typical outpatient sliding scale of 1 unit of sugar 150 - 200, and then 2 units for every 50 over 201. Insulin should not be held unless the patient is n.p.o., in which case NovoLog should be held at that meal only. Lantus and sliding scale should always be given.   Thank you for the kind request for consultation. I will follow along with you.  ____________________________ A. Wendall MolaMelissa Aulden Calise, MD ams:cbb D: 04/05/2012 13:44:03 ET T: 04/05/2012 15:48:19 ET JOB#: [id number]  cc: A. Wendall MolaMelissa Kaitlyn Skowron, MD, <Dictator> Macy MisA. MELISSA Timm Bonenberger MD ELECTRONICALLY SIGNED 04/19/2012 17:00

## 2014-10-23 NOTE — Op Note (Signed)
PATIENT NAME:  Ryan Wall, Ryan Wall MR#:  [id number] DATE OF BIRTH:  12/03/1929  DATE OF PROCEDURE:  02/25/2012  PRIMARY CARE PHYSICIAN:  Dr. Burnadette PopLinthavong   PREPROCEDURE DIAGNOSIS: Junctional bradycardia, sinus pauses.   PROCEDURE: Single-chamber pacemaker implantation.   POSTPROCEDURE DIAGNOSIS: Intermittent ventricular pacing.   INDICATION: The patient is an 81 year old gentleman who was admitted on  02/18/2012 with abdominal discomfort. During the hospitalization, the patient was then noted to be markedly bradycardic with marked sinus bradycardia, junctional bradycardia, and sinus pauses of greater than 4 seconds. The procedure, risks, benefits, and alternatives of pacemaker implantation were explained to the patient and the patient's son who has power of attorney, and informed written consent was signed.   PROCEDURE: The patient was brought to the operating room in a fasting state. The left pectoral region was prepped and draped in the usual sterile manner. Anesthesia was obtained with 1% Xylocaine locally. A 6-cm incision was performed over the left pectoral region. The pacemaker pocket was generated by electrocautery and blunt dissection. Access was obtained to the left subclavian vein by fine needle aspiration. A Medtronic ventricular lead was positioned into the right ventricular apical septum. After proper thresholds were obtained, the lead was sutured in place. The pacemaker pocket was irrigated with gentamicin solution. The lead was connected to a rate-responsive single-chamber pacemaker generator and positioned in the pocket. The pocket was closed with 2-0 and 4-0 Vicryl, respectively. Steri-Strips and pressure dressing were applied.   ____________________________ Marcina MillardAlexander Dereka Lueras, MD ap:bjt D: 02/25/2012 13:25:59 ET T: 02/25/2012 13:35:26 ET JOB#: [id number]  cc: Marcina MillardAlexander Gerardo Territo, MD, <Dictator> Marcina MillardALEXANDER Nohemi Nicklaus MD ELECTRONICALLY SIGNED 03/09/2012 9:00

## 2014-10-23 NOTE — Consult Note (Signed)
BP up some, denies abd pain, tol clear liq diet, wants to try full liquids.  Exam of abd shows flat and non tender to deep palpation.  Will advance to full liquid and if OK to mechanical soft 2000cal diabetic.  Electronic Signatures: Scot JunElliott, Johnthan Axtman T (MD)  (Signed on 16-Jul-13 13:32)  Authored  Last Updated: 16-Jul-13 13:32 by Scot JunElliott, Ayce Pietrzyk T (MD)

## 2014-10-23 NOTE — Discharge Summary (Signed)
PATIENT NAME:  Ryan Wall, Ryan Wall MR#:  [id number] DATE OF BIRTH:  10-19-1929  DATE OF ADMISSION:  01/17/2012 DATE OF DISCHARGE:  01/22/2012  DISCHARGE DIAGNOSES:  1. Chronic low back pain with history of old compression fracture seen on MRI. 2. Abdominal pain, resolved.  3. Dementia.  4. Insulin-dependent diabetes.  5. Hyperlipidemia.  6. Hypertension.  7. Benign prostatic hypertrophy.  8. History of transient ischemic attack, on Plavix.   DISCHARGE MEDICATIONS:  1. Plavix 75 mg Wall.o. daily.  2. Colace 100 mg Wall.o. twice a day. 3. Aricept 10 mg Wall.o. at bedtime.  4. Novolin 6 units subcutaneous with breakfast, 5 units with lunch and dinner.  5. Lisinopril 20 mg Wall.o. daily.  6. Lovastatin 20 mg Wall.o. daily.  7. Tamsulosin 0.4 mg two capsules Wall.o. daily.  8. Detrol 4 mg Wall.o. daily.  9. Norvasc 10 mg Wall.o. daily.  10. Ultram 50 mg Wall.o. every 6 hours Wall.r.n. for pain.  11. Tylenol 1 gram Wall.o. three times daily, scheduled.   CONSULTANTS:  1. Orthopedics. 2. Gastroenterology.   PROCEDURES: The patient underwent MRI of the lumbar spine that did show old compression fractures, but no acute fractures.   PERTINENT LABS: On day of discharge, sodium is 142, potassium 3.9, and creatinine 0.68.   BRIEF HOSPITAL COURSE:  1. Abdominal pain. The patient initially came in complaining of abdominal discomfort radiating into the back. He was evaluated by gastroenterology who did not see any acute processes. The patient had imaging performed that was negative. The patient's abdominal discomfort spontaneously resolved within 24 hours upon admission. He did continue to complain of his back pain.  2. Chronic back discomfort. The patient continues to complain of low back pain. Upon further evaluation, he was evaluated by orthopedics who recommended MRI of the low back which did show chronic compression fractures, but no acute fractures, and recommended a corset and pain control. The pain has been controlled with  scheduled Tylenol and Ultram as needed. We will discontinue the morphine at this time. He did have difficulty ambulating, getting out of bed on his own. Therefore, physical therapy will be implemented which can be continued at the skilled nursing facility.  3. Insulin-dependent diabetes. Because there was poor initial oral intake, his blood sugars were quite low and his insulin was held, but after his oral intake improved his blood sugars began to rise and become out of control. We reinstituted his regimen at home and increased the Lantus to 10 units at bedtime.  4. His other chronic medical issues remained stable. He will continue on his home regimen.   DISPOSITION: He is stable for discharge to [facility] skilled nursing facility for further PT and nursing care. He will followup with Dr. Marisue IvanKanhka [name] within 14 days of discharge. ____________________________ Marisue IvanKanhka Allysen Lazo, MD kl:slb D: 01/22/2012 12:30:00 ET T: 01/22/2012 15:02:09 ET JOB#: [id number]  cc: Marisue IvanKanhka Analyce Tavares, MD, <Dictator> Marisue IvanKANHKA Anitha Kreiser MD ELECTRONICALLY SIGNED 02/04/2012 8:31

## 2014-10-23 NOTE — Consult Note (Signed)
PATIENT NAME:  Ryan Wall, Ryan Wall MR#:  [id number] DATE OF BIRTH:  [birth year]  DATE OF CONSULTATION:  02/29/2012  REFERRING PHYSICIAN:  Dion Body, MD  CONSULTING PHYSICIAN:  A. Lavone Orn, MD  CHIEF COMPLAINT: Uncontrolled diabetes.   HISTORY OF PRESENT ILLNESS: This is an 81 year old male who was admitted on August 15th for bradycardia and severe hyperglycemia from [facility] home. The patient is well known to me. He has type I diabetes which has been uncontrolled with worsened control over the last year as his dementia has worsened and he has not been able to care for himself. I helped get him to [facility] after he had been in another nursing home where he was not able to get his sliding scale insulin. Previous to that he was living with his wife at [facility] independent living but his wife was not able to assist him to the degree that he required. Apparently he had a recent change in his insulin regimen by Dr. Lovie Macadamia. He had been on Lantus 12 units at bedtime and NovoLog 10 units t.i.d. a.c., however, on July 31st was switched to NovoLog mix 11 units b.i.d. at meals and NovoLog 7 units at lunch. I do not have a MAR from his nursing home to review. Here in the hospital he has been receiving Lantus and regular insulin sliding scale and recently metformin was added as well. Last night, Lantus was held. Today blood sugars have ranged from 110's to 310's and he has been given 12 units on the sliding scale only.    At the time the patient was seen his wife was at the bedside. She states he has been eating very well and typically finishes most of his tray. The patient is a poor historian. He denies any complaints at this time.   PAST MEDICAL HISTORY:  1. Type I diabetes since the 1970's. 2. Hyperlipidemia. 3. Hypertension. 4. Gastroesophageal reflux disease. 5. PAD.  6. Multinodular goiter.  7. Bradycardia, status post pacemaker placement.  8. Dementia.   PAST  SURGICAL HISTORY: None other.  INPATIENT MEDICATIONS:  1. Regular insulin sliding scale.  2. Norvasc 10 mg daily.  3. Aricept 10 mg daily.  4. Zestril 40 mg daily.  5. Mevacor 20 mg daily.  6. Detrol LA 4 mg daily.  7. Plavix 75 mg daily.  8. Lovenox 40 mg sub-Q daily.  9. Metformin 500 mg b.i.d.   ALLERGIES: No known drug allergies.   SOCIAL HISTORY: The patient resides at [facility]. He is married and his wife, Raynelle Chary, lives at [place]. Retired. No tobacco or alcohol use.    FAMILY HISTORY: No known diabetes.   REVIEW OF SYSTEMS: The patient reports no to 10 point review of systems, although this is not very reliable as he is a poor historian.   PHYSICAL EXAMINATION:   VITAL SIGNS: Temperature 98.3, pulse 64, respirations 18, blood pressure 114/73, pulse oximetry 98% on room air.   GENERAL: Elderly white male in no acute distress, appears uncomfortable.   HEENT: Extraocular movements are intact. Oropharynx is clear.   NECK: Supple. Thyroid is irregular throughout. No neck tenderness.   CARDIAC: Bradycardic. Systolic murmur is present. No carotid bruit.   PULMONARY: Clear to auscultation bilaterally. No wheeze.   ABDOMEN: Nontender, nondistended.   EXTREMITIES: No edema is present.   SKIN: No rash or dermatopathy is present.   PSYCHIATRIC: Alert, oriented to self.   LABORATORY, DIAGNOSTIC, AND RADIOLOGICAL DATA: 02/26/2012 BUN 16, creatinine 0.6,  sodium 140, potassium 3.8, eGFR greater than 60, WBC 8.4, hematocrit 42.3. 02/29/2012 hemoglobin A1c 8.9%.   ASSESSMENT: This is an 81 year old male with uncontrolled type I diabetes most recently with hyperglycemia although yesterday with severe hypoglycemia in the 40's.   RECOMMENDATIONS:  1. Restart bedtime Lantus 10 units.  2. Give meal time NovoLog 6 units.  3. Change sliding scale to NovoLog and will make less aggressive, plan to give 2 units per 50 over a target of 150.  4. Continue blood sugar monitoring  q.a.c. and at bedtime and will also add a 2 a.m. blood sugar, given tendency for overnight hypoglycemia. Should overnight hypoglycemia continue, we could consider moving his Lantus to morning dosing.   Thank you for the kind request for consultation. I will continue to follow along with you.   ____________________________ A. Lavone Orn, MD ams:drc D: 02/29/2012 15:44:02 ET T: 02/29/2012 16:16:19 ET JOB#: [id number]  cc: A. Lavone Orn, MD, <Dictator> Sherlon Handing MD ELECTRONICALLY SIGNED 03/02/2012 16:49

## 2014-10-23 NOTE — Consult Note (Signed)
PATIENT NAME:  Ryan Wall, Ryan Wall MR#:  [id number] DATE OF BIRTH:  11/03/1929  DATE OF CONSULTATION:  01/18/2012  REFERRING PHYSICIAN:   CONSULTING PHYSICIAN:  Cala BradfordKimberly A. Arvilla MarketMills, ANP  ADDENDUM   The case was discussed with Dr. Mechele CollinElliott in collaboration of care. He agrees with proton pump inhibitor daily. Hemoccult cards. Alkaline phosphatase baseline is in the 50s, he clearly has elevation in alkaline phosphatase and would obtain 5'-nucleotidase to discern whether he is having elevation from bone or liver. The patient has significant back pain. Recommends surgical consult. Because of the question of pancreatic abnormality, would recommend CA-19-9. If these studies are normal, would recommend PSA. The patient will need to have a follow-up CT scan of the abdomen to look at the pancreatic head finding. No luminal evaluation recommended at this time.  ____________________________ Ranae PlumberKimberly A. Arvilla MarketMills, ANP kam:ap D: 01/18/2012 17:07:00 ET T: 01/18/2012 18:02:27 ET JOB#: [id number]  cc: Cala BradfordKimberly A. Arvilla MarketMills, ANP, <Dictator> Ranae PlumberKimberly A. Suzette BattiestMills RN, MSN, ANP-BC Adult Nurse Practitioner ELECTRONICALLY SIGNED 01/22/2012 11:24

## 2014-10-23 NOTE — Consult Note (Signed)
PATIENT NAME:  Ryan Wall, Ryan Wall MR#:  [id number] DATE OF BIRTH:  September 23, 1929  DATE OF CONSULTATION:  01/18/2012  REFERRING PHYSICIAN:  Fulton Reek, MD CONSULTING PHYSICIAN:  Janalyn Harder. Cala Bradford, MD  REASON FOR CONSULTATION: Abdominal pain.   HISTORY OF PRESENT ILLNESS: This 81 year old patient of Dr. Netty Starring was admitted to the hospital 01/17/2012 for progressive lower back and abdominal pain over the last 4 to 6 weeks without definitive diagnosis. This was his third visit to the Emergency Room. He was found to have a blood sugar greater than 400 and a CT scan of the abdomen that suggested pancreatic abnormality -etiology is unclear. Gastroenterology has been asked to see the patient regarding further evaluation and management.   PAST MEDICAL HISTORY:  1. Alzheimer's dementia.  2. Previous stroke.  3. Hyperlipidemia.  4. Type 2 diabetes mellitus on insulin.  5. Chronic low back pain.  6. Benign prostatic hypertrophy. .  7. History of gastroesophageal reflux disease.   PAST SURGICAL HISTORY:  1. Appendectomy.  2. Right inguinal hernia repair by Dr. Rochel Brome 04/06/2011.   MEDICATIONS ON ADMISSION PER ADMISSION RECORD:  1. Flomax 0.4 mg twice daily.  2. K-Dur 20 milliequivalents daily.  3. NovoLog 10 units subcutaneous 3 times daily.  4. Mevacor 20 mg daily.  5. Zestril 20 mg daily.  6. Lantus 12 units subcutaneous at bedtime.  7. Lasix 20 mg daily. 8. Aricept 10 mg daily. 9. Plavix 75 mg daily. 10. Detrol LA 4 mg daily.  11. Myrbetriq 25 mg extended release daily.   ALLERGIES: No known drug allergies.   HABITS: Previous tobacco, negative active alcohol.   FAMILY HISTORY: Chart review shows diabetes, hypertension, and stroke.   REVIEW OF SYSTEMS: Unable to obtain from the patient due to dementia. No family members available. The patient reports abdominal discomfort for a long time but cannot identify triggers, or characterize in any way. Nursing staff  reports he has had a poor appetite. Family members are unavailable to give further history.    PHYSICAL EXAMINATION:  VITAL SIGNS: Temperature 97.7, pulse 59, respiratory rate 20, blood pressure 179/80. Pulse oximetry 96% on room air.   GENERAL: Elderly Caucasian male sleeping, arouses with his name being called.   HEENT: Head is normocephalic. Conjunctivae pale pink. Sclerae anicteric. Oral mucosa is dry.   NECK: Supple. Trachea midline.   HEART: Heart tones S1, S2.   LUNGS: Clear to auscultation anteriorly. Posteriorly he has some coarse breath sounds in the bases.   ABDOMEN: Soft. Bowel sounds are active. He reports some tenderness, nonspecific, lower abdomen and upper abdomen. He cries out in pain, "Oh my back" when he is turned. He points to the lower lumbar area of pain. The back pain is more prominent than abdominal pain on exam.   RECTAL: Deferred.   EXTREMITIES: Lower extremities with some dry skin changes noted distal extremity. No cyanosis or clubbing.   SKIN: Warm and dry without rash.   PSYCH: He has flat affect, sleeps, minimal response, unable to identify the place, knows his name, knows he was born on the [birth year]th, but needs to helped with the month, and then he was able to bring forward the year of his birth. Lethargic.   LABORATORY, DIAGNOSTIC, AND RADIOLOGICAL DATA: Admission blood work with glucose 404, electrolytes and renal function unremarkable. Albumin 3.5, alkaline phosphatase elevated at 163. CPK and troponin negative. WBC 7.3, hemoglobin 13.3, MCV 101. Urine and blood cultures: No growth. Lactic acid was 1.3.  Repeat laboratory studies 01/18/2012 with glucose 245, BUN 10, creatinine 0.70, potassium 3.3, albumin 3.1, total bilirubin 1.1, alkaline phosphatase 157, AST 16, ALT 19.   CT scan of the abdomen with pelvis performed 01/17/2012 for indication of diffuse abdominal pain out of proportion to the exam. Findings showed left lower lobe atelectasis and/or  pneumonia noted. Mild infiltrate versus atelectasis right lung base cannot be excluded. There are pleural effusions in the left and right noted. Stable enhancing nodule noted in the liver, unchanged from 06/29/2011 consistent with hemangioma. Spleen normal. Adrenals normal. Mild edema in the pancreatic head. Mild pancreatitis cannot be excluded. No biliary distention. Tiny rounded opacity in the right. This is most likely vascular. A tiny pancreatic ductal stone cannot be excluded. Gallbladder is nondistended without pericholecystic fluid collection noted. No free air. No bowel distention. Stool is noted throughout the colon. Positive pelvic phlebolith  Admission chest x-ray today shows atelectasis versus infiltrate left lower lobe, stable, as well as possibly a small effusion.   Most recent colonoscopy 2001, one polyp removed -pathology records are unavailable.   IMPRESSION: 1.  The patient reporting outpatient abdominal pain 4 to 6 weeks duration, he has also had lower back pain. No definitive diagnosis. He has been to the Emergency Room for this. The patient cries out in pain, but it is for his lower back on exam today. Records from outpatient reviewed and he does have multilevel compression deformities found on most recent back x-ray dated 01/17/2012. The T12 and L1 deformities are chronic. The L3 deformity is indeterminate. There are multilevel degenerative changes evidenced by mild subchondral sclerosis, area of osteophy 2. Etiology of abdominal pain has been unclear. CT scan today picked up possible abnormality with mild edema in the pancreatic head, tiny, rounded opacity in the right. This could be vascular versus tiny pancreatic ductal stone to be excluded. Gallbladder study was unremarkable. Elevation in alk phos from baseline-r/o bone or liver cause. R/o pancreatitic malignancy.  3. No history of EGD, remote history of colonoscopy. Results unavailable. The patient is unable to give history  regarding abdominal complaints. The physical examination reports nonspecific abdominal tenderness with palpation inconsistent. The patient has more complaints with his back at the time of this evaluation.  4. Abnormal CXR on antibiotic therapy  PLAN:  1. The patient would not be able to drink bowel prep for a colonoscopy due to mental status with severe dementia. He has abnormal CXR and is on antibiotics.  No recommendation for luminal evaluation at this time.  2. Would recommend starting Protonix 40 mg IV twice a day. Chart review shows he has had a history of gastroesophageal reflux disease in the past.  3.  He does have elevated alkaline phosphatase, and a normal lipase of 170. Albumin has drifted since admission to 3.1, alkaline phosphatase 157. The remaining liver panel unremarkable. EMR review shows normal alkaline phosphatase November 2012 and earlier in the 40s to 28s range, so this does represent a change for this patient.   The case is to be discussed with Dr. Vira Agar in collaboration of care for further gastrointestinal evaluation recommendation.   These services provided by Denice Paradise, ANP in collaboration with Gaylyn Cheers, MD.    ____________________________ Janalyn Harder. Jerelene Redden, ANP kam:ap D: 01/18/2012 15:33:27 ET T: 01/18/2012 16:27:40 ET JOB#: [id number]  cc: Joelene Millin A. Jerelene Redden, ANP, <Dictator> Janalyn Harder. Sherlyn Hay, MSN, ANP-BC Adult Nurse Practitioner ELECTRONICALLY SIGNED 01/18/2012 17:23

## 2014-10-23 NOTE — Discharge Summary (Signed)
PATIENT NAME:  Ryan MinionBOSWELL, Anselm P MR#:  [id number] DATE OF BIRTH:  1930-03-02  DATE OF ADMISSION:  04/03/2012 DATE OF DISCHARGE:  04/05/2012  PRIMARY CARE PHYSICIAN:  Dr. Janit PaganLinthavong CONSULTING ENDOCRINOLOGIST:  Dr. Wendall MolaMelissa Solum  DISCHARGE DIAGNOSES:  1. Altered mental status.  2. Hypoglycemia and hyperglycemia.  3. Brittle diabetes.  4. Alzheimer's dementia.  5. History of prior cerebrovascular accident.  HISTORY OF PRESENT ILLNESS: This is a pleasantly demented 81 year old gentleman who lives at [facility].  He was admitted on 09/29 from the nursing home with what was called altered mental status. At baseline he has Alzheimer's dementia. He had an elevated sugar at one point and was given extra insulin and then his sugars went to 35. He was given D50 by EMS and his sugars increased to 390 and he became sluggish and was sent to the ED. In the Emergency Room he had a normal white blood count and blood work was relatively unrevealing. His sugar was elevated at 391. He was admitted for observation, IV fluids, and management of his diabetes.   HOSPITAL COURSE BY ISSUE:   1. Altered mental status:  His altered mental status improved as he received some IV fluids and his sugars were normalized. It appears he is at his baseline, although this is difficult to assess. He is able to sit up in a chair and feed himself. He can answer yes or no questions. There is no agitation that was reported.  2. Hypoglycemia related to diabetes: He was seen by Dr. Tedd SiasSolum. He had recently moved from one facility to the other and this may have affected his brittle diabetes. His insulin was adjusted and he will be discharged on the new dosing and follow up with Dr. Tedd SiasSolum.   3. Hypertension: He will continue on his outpatient medications  for this.   DISCHARGE DIET: ADA diet.   DISCHARGE DISPOSITION: He will be discharged to the nursing home [facility].   DISCHARGE MEDICATIONS:   1. Lantus insulin 11 units  subcutaneous at bedtime.  2. NovoLog insulin 5 units with breakfast, 5 units with supper, and 6 units with lunch.  3. Detrol LA 4 mg once a day.  4. Lovastatin 20 mg once a day.  5. Donepezil 10 mg once a day at bedtime.  6. Colace 100 mg twice a day.  7. Lisinopril 40 mg once a day.  8. Plavix 75 mg once a day.  9. Amlodipine 10 mg once a day.  HOSPITAL FOLLOWUP:  1. The patient will follow up with Dr. Burnadette PopLinthavong in one to two weeks. 2. The patient will follow up with Dr. Tedd SiasSolum in three weeks.    TIME SPENT ON DISCHARGE:  Greater than 35 minutes.   ____________________________ Stann Mainlandavid P. Sampson GoonFitzgerald, MD dpf:bjt D: 04/05/2012 15:35:00 ET T: 04/05/2012 16:05:17 ET JOB#: [id number]  cc: Stann Mainlandavid P. Sampson GoonFitzgerald, MD, <Dictator> Chadwick Reiswig Sampson GoonFITZGERALD MD ELECTRONICALLY SIGNED 04/12/2012 21:45

## 2014-10-23 NOTE — Consult Note (Signed)
81 year old male referred for evaluation of  back pain. and xrays reviewed and patient examined. As  best I can tell his motors are intact in the lower extremities. review of his xrays, he may have an acute compression fracture at L3. A lumbosacral corset could be tried if it does not interfere with his abdominal situation. I would recommend a lumbar MRI if and when he could tolerate lying in the machine.  Electronic Signatures: Clare GandySmith, Rasheema Truluck E (MD)  (Signed on 15-Jul-13 19:27)  Authored  Last Updated: 15-Jul-13 19:27 by Clare GandySmith, Coriann Brouhard E (MD)

## 2014-10-23 NOTE — Consult Note (Signed)
PATIENT NAME:  Ryan Wall, Ryan Wall MR#:  [id number] DATE OF BIRTH:  23-Jun-1930  DATE OF CONSULTATION:  02/18/2012  REFERRING PHYSICIAN:  Dr. Jacques Navy  CONSULTING PHYSICIAN:  Lamar Blinks, MD  PRIMARY CARE PHYSICIAN: Dr. Burnadette Pop   REASON FOR CONSULTATION: Diabetes mellitus, hypertension, bradycardia and weakness.   CHIEF COMPLAINT: "I am weak."   HISTORY OF PRESENT ILLNESS: This is an 81 year old male with known hypertension on appropriate medications including ACE inhibitor and calcium channel blocker with diabetes mellitus fairly well treated. He does also have some hyperlipidemia as well, currently stable on appropriate treatment. Patient has had new onset of abdominal discomfort and bladder discomfort with urinary tract issues for which he has had some . He has not had any chest pain, syncope or dizziness. He was seen in the Emergency Room for these issues and had an EKG. That EKG shows sinus bradycardia, left axis deviation, right bundle branch block. He has a normal troponin, CK-MB without evidence of myocardial infarction. There was concerns that the patient had symptomatic bradycardia although his bradycardia has been relatively stable throughout today and there has been no evidence of syncope or critical heart block.   REVIEW OF SYSTEMS: Remainder of his review of systems negative for vision change, ringing in the ears, hearing loss, cough, congestion, heartburn, nausea, vomiting, diarrhea, bloody stools, stomach pain, extremity pain, leg weakness, cramping of the buttocks, known blood clots, headaches, blackouts, dizzy spells, nosebleeds, congestion, trouble swallowing, frequent urination, urination at night, muscle weakness, numbness, anxiety, depression, skin lesions, skin rashes.   PAST MEDICAL HISTORY:  1. Hypertension.  2. Diabetes mellitus.  3. Hyperlipidemia.   FAMILY HISTORY: Father had early onset of cardiovascular disease and death.   SOCIAL HISTORY: He has remote tobacco  use. Currently denies alcohol use.   ALLERGIES: He has no known drug allergies.   CURRENT MEDICATIONS: As listed.   PHYSICAL EXAMINATION:  VITAL SIGNS: Blood pressure 110/68 bilaterally, heart rate 52 upright, reclining, and slightly regular.   GENERAL: He is a well appearing male in no acute distress.   HEENT: No icterus, thyromegaly, ulcers, hemorrhage, or xanthelasma.   CARDIOVASCULAR: Regular rate and rhythm. Normal S1, S2. 2/6 apical murmur consistent with mitral regurgitation. Point of maximal impulse is diffuse. Carotid upstroke normal without bruit. Jugular venous pressure normal.   LUNGS: Lungs have few basilar crackles with normal respirations.   ABDOMEN: Soft, nontender without hepatosplenomegaly or masses. Abdominal aorta is normal size without bruit.   EXTREMITIES: 2+ radial, femoral, dorsal pedal pulses with no lower extremity edema, cyanosis, clubbing, ulcers.   NEUROLOGIC: He is oriented to time, place, and person with normal mood and affect.   ASSESSMENT: 82 year old male with hypertension, hyperlipidemia, diabetes mellitus with weakness and fatigue of unknown etiology having an EKG and telemetry showing sinus bradycardia, although not symptomatic at this time.   RECOMMENDATIONS:  1. Continue to follow telemetry for further issues of possible significant bradycardia and heart block with ambulation following for chronotropic incompetence as well.  2. Echocardiogram for LV systolic dysfunction, valvular heart disease which may be contributing to current issues. 3. Continue hypertension control. Would avoid beta blockers and/or calcium channel blockers due to concerns of heart rate and heart block. 4. Further treatment of bladder and abdominal discomfort and further investigation and treatment options. 5. Diabetes mellitus control with a goal hemoglobin A1c below 7 without change of medications at this time.  6. Hyperlipidemia. Treatment with statins for further risk  reduction in cardiovascular disease with a goal  LDL below 100 mg/dL.  7. Further treatment options after medication management and treatment and ambulation.  ____________________________ Lamar BlinksBruce J. Jesalyn Finazzo, MD bjk:cms D: 02/18/2012 08:59:03 ET T: 02/18/2012 10:02:32 ET JOB#: [id number]  cc: Lamar BlinksBruce J. Natoshia Souter, MD, <Dictator> Lamar BlinksBRUCE J Rashid Whitenight MD ELECTRONICALLY SIGNED 02/20/2012 9:32

## 2014-10-23 NOTE — Consult Note (Signed)
Pt exam showed a flat abdomen, some bowel sounds present, old appendix scar, pt is demented, thinks [place] [facility]. Agree with NP note,  no acute abdomen type picture here at this time.  Check labs and follow with you.  No luminal exam planned.  Electronic Signatures: Scot JunElliott, Addie Cederberg T (MD)  (Signed on 15-Jul-13 19:19)  Authored  Last Updated: 15-Jul-13 19:19 by Scot JunElliott, Anice Wilshire T (MD)

## 2014-10-23 NOTE — Consult Note (Signed)
Brief Consult Note: Diagnosis: Bradycardia, sinus pauses.   Patient was seen by consultant.   Consult note dictated.   Comments: REC  ppm.  Electronic Signatures: Marcina MillardParaschos, Keeya Dyckman (MD)  (Signed 22-Aug-13 12:09)  Authored: Brief Consult Note   Last Updated: 22-Aug-13 12:09 by Marcina MillardParaschos, Xuan Mateus (MD)

## 2014-10-26 NOTE — Consult Note (Signed)
PATIENT NAME:  Ryan Wall, Ryan Wall MR#:  [id number] DATE OF BIRTH:  [birth year]  DATE OF CONSULTATION:  02/14/2013  REFERRING PHYSICIAN:  Tracie Harrier, MD  CONSULTING PHYSICIAN:  A. Lavone Orn, MD  CHIEF COMPLAINT: Uncontrolled diabetes.   HISTORY OF PRESENT ILLNESS: This is an 81 year old male who was admitted yesterday for severe hypoglycemia. The patient has multiple medical problems including dementia. He has been living at home with a full-time employed caregiver. I spoke to the caregiver at the bedside. She had no prior experience administering insulin and had been administering his basal insulin rather than prandial insulin throughout the day. She estimates she gave in total Lantus 56 units over the course of the day until the patient developed severe hypoglycemia. EMS was called and apparently blood sugar dropped as low as the 20s, and the patient was not responsive. After treatment with IV dextrose, the patient's blood sugar improved, and he was brought to the ER. Due to persistent hypoglycemia, the patient was started on IV dextrose and admitted. He remains on IV dextrose at this time. He is in his typical mental state. He is disoriented. He has no acute complaints.   MEDICAL HISTORY:  1. Type 1 diabetes.  2. Hypertension.  3. Hyperlipidemia.  4. GERD.  5. Peripheral vascular disease.  6. Multinodular goiter.  7. Dementia.  8. History of TIA.  9. History of bradycardia, status post pacemaker placement.  10. BPH.  11. Chronic arthritis.   CURRENT INPATIENT MEDICATIONS:  1. Senokot-S 1 tab q.12 hours.  2. Levaquin 750 mg q.12 hours.  3. Protonic 40 mg once daily.  4. Aspirin 81 mg daily.  5. KCl ER 20 mEq q.8 hours.  6. Heparin 5000 units subcutaneous q.8 hours.  7. D5 0.45 plus KCl 20 mEq at 125 mL/hour.   ALLERGIES: ATORVASTATIN, RAMIPRIL.   FAMILY HISTORY: No known diabetes.   SOCIAL HISTORY: The patient has been residing with a son. He is married. Wife lives at [facility]. No alcohol or tobacco use.   REVIEW OF SYSTEMS: Unable to obtain as the patient is not reliable.   PHYSICAL EXAMINATION:  VITAL SIGNS: Height 71.9 inches, weight 175 pounds, BMI 23.8, pulse 60, temp 98, respirations 20, BP 147/91.  GENERAL: Well-developed white male in no acute distress.  HEENT: EOMI. Oropharynx is clear.  NECK: Supple. Thyromegaly is present with an irregular thyroid texture, no neck pain.  CARDIAC: Regular rate and rhythm. No carotid bruit.  PULMONARY: Clear to auscultation bilaterally. No wheeze.  ABDOMEN: Diffusely soft, nontender and nondistended.  EXTREMITIES: No edema is present.  SKIN: No rash or dermatopathy is present.  PSYCHIATRIC: The patient oriented to self only. He is calm and cooperative.   LABORATORY: Blood sugar today at 7:40 a.m. was 196. Creatinine 0.6. EGFR greater than 60. Calcium 8.0. Sodium 140, potassium 3.6. Albumin 2.8, AST 23, ALT 17. Hematocrit 36.5%, WBC 5.9, platelets 186.   GENERAL: An 81 year old male with long-standing type 1 diabetes complicated by neuropathy with hospitalization for hypoglycemia after caregiver erroneously gave excessive basal insulin.   RECOMMENDATIONS:  1. Suspect effects of the Lantus given yesterday have worn off; therefore, I will stop his IV dextrose. Will give a 1-time low dose of Lantus 3 units now and then initiate at bedtime Lantus 8 units, likely to be titrated up to 10 units tomorrow.  2. Start NovoLog sliding scale 2 units per every 50 over a target of 200.  3. Goal in this patient is to  avoid hypoglycemia; therefore, reasonable control would be blood sugars in the range of 100-250.  4. Encourage good p.o. nutrition.   Thank you for the kind request for consultation. I will follow along with you.   ____________________________ A. Lavone Orn, MD ams:gb D: 02/14/2013 21:21:12 ET T: 02/14/2013 21:59:10 ET JOB#: [id number]  cc: A. Lavone Orn, MD, <Dictator> Sherlon Handing  MD ELECTRONICALLY SIGNED 02/27/2013 17:11

## 2014-10-26 NOTE — Discharge Summary (Signed)
PATIENT NAME:  Ryan Wall, Ryan Wall MR#:  [id number] DATE OF BIRTH:  09/29/1929     PRELIMINARY DISCHARGE SUMMARY  DATE OF ADMISSION:  08/29/2012 DATE OF DISCHARGE:  To be determined  DISCHARGE DIAGNOSES: 1.  Pneumonia.  2.  Generalized weakness.  3.  Insulin-dependent diabetes that is brittle. 4.  Hypertension.  5.  Moderate dementia.  6.  History of TIA, not a candidate for anticoagulation due to falls.  7.  Hyperlipidemia.  8.  BPH.   DISCHARGE MEDICATIONS: 1.  Tylenol 325 mg 1 to 2 tabs Wall.o. every 4 hours Wall.r.n. for fever or pain.  2.  Amlodipine 10 mg Wall.o. daily.  3.  Aspirin 81 mg Wall.o. daily.  4.  Colace 100 mg Wall.o. b.i.d.  5.  Lisinopril 40 mg Wall.o. daily.  6.  Detrol 4 mg Wall.o. daily.  7.  Insulin aspart 5 units with supper, 8 units with breakfast and 8 units with lunch.  8.  Lantus 8 units daily.  9.  Azithromycin 250 mg to be completed on March 2nd.  CONSULTANTS: Endocrinology per Dr. Tedd SiasSolum.   PERTINENT LABORATORY AND DIAGNOSTIC DATA:  The patient had a negative CT of the head.  Chest x-ray was consistent with possible infiltrate.  On the day prior to discharge sodium 143, potassium 3.8, creatinine 0.6.  Blood glucose has fluctuated from the 30s all the way up to the 300s.   DISPOSITION: The patient is requiring further rehabilitation, place and time to be determined.  He will follow up with Dr. Burnadette PopLinthavong 1 week after discharge from facility.  Addendum to follow.    ____________________________ Marisue IvanKanhka Hollye Pritt, MD kl:ct D: 09/02/2012 08:04:17 ET T: 09/02/2012 08:47:26 ET JOB#: [id number]  cc: Marisue IvanKanhka Valecia Beske, MD, <Dictator> Marisue IvanKANHKA Zein Helbing MD ELECTRONICALLY SIGNED 09/30/2012 7:59

## 2014-10-26 NOTE — H&P (Signed)
PATIENT NAME:  Ryan Wall, Ryan Wall MR#:  [id number] DATE OF BIRTH:  06-27-1930  DATE OF ADMISSION:  02/13/2013  HISTORY OF PRESENT ILLNESS: The patient is an 81 year old Caucasian male with past medical history significant for history of diabetes mellitus type 1, difficult to control with hypoglycemia and hyperglycemia episodes, presents to the hospital hypoglycemic as well as unresponsive. Apparently, he was in the Emergency Room on 02/12/2013 and was treated for hyperglycemia. At that time, his blood glucose levels were as high as 570, according to the patient's family. He was sent back home and asked to check his blood glucose levels every 2 hours, and he was followed by home nurse from agency who unfortunately administered not short-acting insulin however long-acting insulin for all day long and his blood glucose levels slowly drifted down.  Today, in the morning, at around 4:00 a.m., the patient's family was called that his glucose levels were very low in the 40s, and the patient was brought to the Emergency Room for further evaluation. According to Emergency Room notes, the patient was unresponsive and his blood glucose levels were 24 when EMS came in. EMS gave him 1 amp of D50 and blood glucose levels improved to 124. On arrival to the Emergency Room, however, his blood glucose level is still low at around 60s and the patient was started on D5 water with which the patient's glucose levels are stable now at around 100 to 100-tens. Hospitalist services were contacted for admission.   PAST MEDICAL HISTORY: Significant for: 1.  Difficult to control type 1 diabetes mellitus with Hemoglobin A1c 7.4 in December 2013. 2.  Hyperlipidemia. 3.  Hypertension. 4.  Gastroesophageal reflux disease. 5.  Peripheral vascular disease. 6.  Multinodular goiter. 7.  Dementia. 8.  Transient ischemic attack. 9.  Bradycardia status post pacemaker placement in 02/2012.  10.  Generalized weakness, difficulty with ambulation,  deconditioning. 11.  BPH.  12.  Right hip pain.   MEDICATIONS: According to medical records, the patient is on: 1. Tylenol 650 mg Wall.o. 3 times daily.  2.  Amlodipine 10 mg Wall.o. daily. 3.  Aspirin 81 mg Wall.o. daily.  4.  Lantus insulin 12 units subcutaneously once at bedtime. 5.  Lisinopril 40 mg Wall.o. daily. 6.  NovoLog Flex-Pen pen sliding scale. 7.  Senna S 1 tablet twice a day. 8.  In the past, the patient was apparently on NovoLog Flex-Pen 9 units in the morning with breakfast, 10 units before lunch and 5 units in the evening and before supper, but those medications are suspended at this time.   SOCIAL HISTORY: The patient resided in [facility] in the past. Now he was brought a few days ago to his home. Now his wife resides [facility]. He does not smoke cigarettes or drink alcohol. He quit approximately 20 years ago. He has several family members who live close by.  FAMILY HISTORY:  No known diabetes mellitus.  ALLERGIES: ATORVASTATIN AS WELL AS RAMIPRIL.  REVIEW OF SYSTEMS: Difficult to obtain as the patient is very groggy; however, he admits of no significant pains. No shortness of breath. No chest pains. No nausea, vomiting or diarrhea. Denies any fevers, fatigue, weakness, or weight loss. Admits of having good Wall.o. intake. No cough or phlegm production. No urination troubles. Otherwise, I am not able to get much review of systems due to somnolence.  PHYSICAL EXAMINATION:  During my evaluation:  VITAL SIGNS: Temperature 98.3, pulse 60, respiratory rate 20, blood pressure 133/71 and saturation  was 99% on room air.  GENERAL: This is a well-developed, well-nourished Caucasian male in no significant distress, sitting on the stretcher.  HEENT: His pupils are equal and reactive to light. Extraocular movements are intact. No icterus or conjunctivitis. Has normal hearing. No pharyngeal erythema. Mucosa is moist.  NECK: No masses. Supple and nontender.  Thyroid is not  enlarged. No adenopathy. No JVD or carotid bruits bilaterally. Full range of motion.  LUNGS: Clear to auscultation in all fields anteriorly.  No wheezes, diminished breath sounds or labored respirations or increased effort to breathe. No dullness to percussion. Not in overt respiratory distress.  HEART:  S1 and S2 appreciated. No murmurs, gallops or rubs noticed. Rhythm was regular. PMI not lateralized. Chest is nontender to palpation.  EXTREMITIES: 1+ pedal pulses. No lower extremity edema, clubbing or cyanosis was noted.  ABDOMEN: Soft, nontender. Bowel sounds are present. No hepatosplenomegaly or masses were noted.  RECTAL: Deferred.  MUSCLE STRENGTH: Able to move all extremities. No cyanosis, degenerative joint disease or significant kyphosis. Gait is not tested.  SKIN: Does not reveal any rashes. A few erythematous lesions were noted in the lower extremities, anterior shin, but otherwise no nodularity or induration. Skin was warm and dry to palpation.  LYMPHATIC: No adenopathy in the cervical region.  NEUROLOGICAL: Cranial nerves grossly intact. Sensory is intact. No dysarthria or aphasia.  The patient is alert, oriented to person and place, poorly cooperative, memory is impaired, but not significant confusion, agitation or depression. The patient is very slow to respond.  LABORATORY AND DIAGNOSTICS: EKG done in the Emergency Room showed ventricular paced rhythm at 69 beats per minute.  Nonspecific ST-T changes were noted.   BMP showed low potassium level of 2.7, otherwise BMP was unremarkable. Albumin level was 3.2, otherwise unremarkable. Troponin level was normal at 0.04. White blood cell count was normal at 5.8, hemoglobin 13.1 and platelet count 203.   Urinalysis:  Yellow, hazy urine. More than 500 glucose, negative for bilirubin or ketones. Specific gravity 1.013. PH was 7.0. Negative for blood, protein or nitrites. Trace leukocyte esterase and 9 red blood cells, 4 white blood cells, trace  bacteria, no epithelial cells. Mucus was present.   Chest portable single view, 02/13/2013, revealed no evidence of CHF. Left lower lobe atelectasis or pneumonia was noted.  Density in the retrocardiac region was greater today than on 08/10.   ASSESSMENT AND PLAN: 1.  Diabetic coma.  Admit the patient to the medical floor. Continue him on D5 water solution. Follow his mental status very closely. 2.  Diabetes mellitus type 1, insulin-dependent. Get Hemoglobin A1c.  Restart Lantus as well as sliding scale insulin when the patient is fully back to himself. 3.  Hypokalemia.  Supplementing IV.  Get magnesium level. 4.  Hypertension. Restart antihypertensive medications when the patient's blood pressure is high. However, during my evaluation it was 109/71.  5.  Hyperlipidemia. Continue lovastatin if ordered.  6.  Questionable bacterial pneumonia concerning for possible aspiration. Start the patient on antibiotic therapy with Levaquin and get speech therapist to evaluate him.   TIME SPENT: One hour. ____________________________ Katharina Caper, MD rv:sb D: 02/13/2013 07:50:30 ET T: 02/13/2013 09:08:11 ET JOB#: [id number]  cc: Katharina Caper, MD, <Dictator> Eldo Umanzor MD ELECTRONICALLY SIGNED 02/22/2013 12:23

## 2014-10-26 NOTE — Consult Note (Signed)
PATIENT NAME:  Ryan Wall, Ryan Wall MR#:  [id number] DATE OF BIRTH:  1930/05/14  DATE OF CONSULTATION:  08/31/2012  REFERRING PHYSICIAN:  Marisue IvanKanhka Wall   CONSULTING PHYSICIAN:  A. Wendall MolaMelissa Dariella Gillihan, MD  CHIEF COMPLAINT: Type 1 diabetes, uncontrolled.   HISTORY OF PRESENT ILLNESS: This is an 81 year old male seen in consultation for diabetes. The patient is well known to me. He lives at a nursing facility. Last hemoglobin A1c was 7.4% in December. He has had difficult to control diabetes.  We are often challenged by both hyper and hypoglycemia. Outpatient regimen includes Lantus 10 units at bedtime and NovoLog 8 units at breakfast, 10 units at lunch, 5 units at supper, plus correction of 2 units per every 50 over a target of 200. Last night, Lantus was given as ordered. Bedtime blood sugar was 366. Lantus as well as correction insulin was given,  and yet a fasting sugar at 4:00 Ryanm., venous, was 27. It is not clear whether the patient had symptoms. He has fairly severe dementia and is a poor historian. He is apparently eating well. A son, Ryan Wall, and a sister were at the bedside. They state he is eating 100% of his meal tray. He only complains of some back pain.   PAST MEDICAL HISTORY: 1. Type 1 diabetes.  2. Hyperlipidemia.  3. Hypertension.  4. GERD.  5. Peripheral arterial disease.  6. Multinodular goiter.  7. Dementia.  8. History of transient ischemic attack.  9. History of bradycardia, status post pacemaker placement August 2013.   OUTPATIENT MEDICATIONS: 1. Lantus 10 units at bedtime.  2. NovoLog 8/10/5 units t.i.d. before meals plus sliding scale.  3. Aspirin 81 mg daily.  4. Aricept 10 mg daily.  5. Detrol LA extended release 4 mg daily.  6. Lovastatin 20 mg at bedtime.  7. Lisinopril 40 mg daily.   SOCIAL HISTORY: The patient resides at [facility]. He is married. His wife resides [facility]. He does not smoke cigarettes or drink alcohol. He has several family  members who live close by.   FAMILY HISTORY: No known diabetes.   ALLERGIES: ATORVASTATIN AND RAMIPRIL.    REVIEW OF SYSTEMS:  GENERAL: Denies weight loss. No fever.  HEENT: Denies blurred vision. Denies sore throat.  NECK: Denies neck pain.  CARDIAC: Denies chest pain or palpitations.  PULMONARY: Denies cough or shortness of breath.  ABDOMEN: Denies abdominal pain. Reports regular bowel movements.  EXTREMITIES: Denies leg swelling.  SKIN: Denies rash or recent pruritus.   PHYSICAL EXAMINATION:  VITAL SIGNS: Temperature is 97.3, pulse 65, respirations 18, blood pressure 158/91, pulse oximetry 97% on room air.  GENERAL: Well-developed white male, elderly.  PSYCHIATRIC: Poor insight, alert.  HEENT: Mild periorbital edema. Oropharynx is clear.   NECK: Supple.  CARDIAC: Regular rate and rhythm.  PULMONARY: Clear to auscultation bilaterally. No wheeze.  ABDOMEN: Diffusely soft, nontender, nondistended.  EXTREMITIES: No edema is present.  SKIN: No rash or dermatopathy. No bruising on the abdomen.   ASSESSMENT: Type 1 diabetes, poorly controlled.   RECOMMENDATIONS: 1. Continue nightly Lantus 10 units at bedtime.  2. We will adjust sliding scale to target a sugar of 200 rather than 150, as currently ordered.  3. We will add prandial insulin as per outpatient dosing.   I will not be available to see the patient tomorrow, 02/27, however, I will return on 02/28 and will see him at that time.   ____________________________ A. Wendall MolaMelissa Larsen Zettel, MD ams:cb D: 08/31/2012 17:19:56 ET  T: 08/31/2012 18:06:53 ET JOB#: [id number]  cc: A. Wendall Mola, MD, <Dictator> Ryan Mis MD ELECTRONICALLY SIGNED 09/05/2012 15:41

## 2014-10-26 NOTE — Discharge Summary (Signed)
Dates of Admission and Diagnosis:  Date of Admission 31-Aug-2012   Date of Discharge 01-Jan-0001   Admitting Diagnosis per h and p   Final Diagnosis per Dr Loney Hering previous dc summary    Chief Complaint/History of Present Illness see h and p   Thyroid:  01-Mar-14 04:43   Thyroid Stimulating Hormone 2.11 (0.45-4.50 (International Unit)  ----------------------- Pregnant patients have  different reference  ranges for TSH:  - - - - - - - - - -  Pregnant, first trimetser:  0.36 - 2.50 uIU/mL)  Hepatic:  24-Feb-14 16:07   Bilirubin, Total 0.7  Alkaline Phosphatase 90  SGPT (ALT) 22  SGOT (AST) 35  Total Protein, Serum 6.8  25-Feb-14 00:26   Bilirubin, Total 0.6  Alkaline Phosphatase 77  SGPT (ALT) 18  SGOT (AST) 22  Total Protein, Serum 6.4  Albumin, Serum  2.9  Cardiology:  24-Feb-14 16:02   Ventricular Rate 66  Atrial Rate 38  QRS Duration 168  QT 506  QTc 530  R Axis -83  T Axis 89  ECG interpretation Electronic ventricular pacemaker When compared with ECG of 03-Apr-2012 15:37, No significant change was found ----------unconfirmed---------- Confirmed by OVERREAD, NOT ([id number]), editor PEARSON, BARBARA ([id number]) on 08/30/2012 11:57:36 AM  Routine Chem:  24-Feb-14 16:07   Glucose, Serum  522  BUN 18  Creatinine (comp) 0.83  Sodium, Serum 137  Potassium, Serum 4.0  Chloride, Serum 104  CO2, Serum 24  Calcium (Total), Serum  8.2  Anion Gap 9  Osmolality (calc) 299  eGFR (African American) >60  eGFR (Non-African American) >60 (eGFR values <34m/min/1.73 m2 may be an indication of chronic kidney disease (CKD). Calculated eGFR is useful in patients with stable renal function. The eGFR calculation will not be reliable in acutely ill patients when serum creatinine is changing rapidly. It is not useful in  patients on dialysis. The eGFR calculation may not be applicable to patients at the low and high extremes of body sizes, pregnant women, and vegetarians.)   Result Comment glucose - RESULTS VERIFIED BY REPEAT TESTING.  - NOTIFIED OF CRITICAL VALUE  - mpg c/ mark winstead @ 1650 08/29/12  - READ-BACK PROCESS PERFORMED. potassium/ast - Slight hemolysis, interpret results with  - caution.  Result(s) reported on 29 Aug 2012 at 04:52PM.  25-Feb-14 00:26   Glucose, Serum  204  BUN 12  Creatinine (comp) 0.66  Sodium, Serum 144  Potassium, Serum  3.0  Chloride, Serum  108  CO2, Serum 29  Calcium (Total), Serum  8.2  Anion Gap 7  Osmolality (calc) 292  eGFR (African American) >60  eGFR (Non-African American) >60 (eGFR values <610mmin/1.73 m2 may be an indication of chronic kidney disease (CKD). Calculated eGFR is useful in patients with stable renal function. The eGFR calculation will not be reliable in acutely ill patients when serum creatinine is changing rapidly. It is not useful in  patients on dialysis. The eGFR calculation may not be applicable to patients at the low and high extremes of body sizes, pregnant women, and vegetarians.)  26-Feb-14 04:19   Glucose, Serum  27  BUN 13  Creatinine (comp)  0.58  Sodium, Serum 144  Potassium, Serum  3.3  Chloride, Serum  109  CO2, Serum 28  Calcium (Total), Serum  8.1  Anion Gap 7  Osmolality (calc) 283  eGFR (African American) >60  eGFR (Non-African American) >60 (eGFR values <6045min/1.73 m2 may be an indication of chronic kidney disease (CKD). Calculated eGFR is useful in  patients with stable renal function. The eGFR calculation will not be reliable in acutely ill patients when serum creatinine is changing rapidly. It is not useful in  patients on dialysis. The eGFR calculation may not be applicable to patients at the low and high extremes of body sizes, pregnant women, and vegetarians.)  Result Comment GLUCOSE - RESULTS VERIFIED BY REPEAT TESTING.  - NOTIFIED OF CRITICAL VALUE  - CALLED TO [place] 2/26 0620  - NBB  - READ-BACK PROCESS PERFORMED.  Result(s) reported  on 31 Aug 2012 at 12:19PM.  27-Feb-14 04:11   Glucose, Serum  34  BUN 10  Creatinine (comp) 0.60  Sodium, Serum 143  Potassium, Serum 3.8  Chloride, Serum  109  CO2, Serum 31  Calcium (Total), Serum 8.5  Anion Gap  3  Osmolality (calc) 280  eGFR (African American) >60  eGFR (Non-African American) >60 (eGFR values <80m/min/1.73 m2 may be an indication of chronic kidney disease (CKD). Calculated eGFR is useful in patients with stable renal function. The eGFR calculation will not be reliable in acutely ill patients when serum creatinine is changing rapidly. It is not useful in  patients on dialysis. The eGFR calculation may not be applicable to patients at the low and high extremes of body sizes, pregnant women, and vegetarians.)  Result Comment glucose - RESULTS VERIFIED BY REPEAT TESTING.  - READ-BACK PROCESS PERFORMED.  - NOTIFIED OF CRITICAL VALUE  - c/ana moore 2/27/14at [id number]  Result(s) reported on 01 Sep 2012 at 05:44AM.  01-Mar-14 04:43   Glucose, Serum  111  BUN 16  Creatinine (comp) 0.65  Sodium, Serum 140  Potassium, Serum 3.8  Chloride, Serum 106  CO2, Serum 27  Calcium (Total), Serum  8.2  Anion Gap 7  Osmolality (calc) 281  eGFR (African American) >60  eGFR (Non-African American) >60 (eGFR values <658mmin/1.73 m2 may be an indication of chronic kidney disease (CKD). Calculated eGFR is useful in patients with stable renal function. The eGFR calculation will not be reliable in acutely ill patients when serum creatinine is changing rapidly. It is not useful in  patients on dialysis. The eGFR calculation may not be applicable to patients at the low and high extremes of body sizes, pregnant women, and vegetarians.)  Cardiac:  24-Feb-14 16:07   CK, Total  475  CPK-MB, Serum  6.4 (Result(s) reported on 29 Aug 2012 at 10:13PM.)  Troponin I 0.03 (0.00-0.05 0.05 ng/mL or less: NEGATIVE  Repeat testing in 3-6 hrs  if clinically indicated. >0.05 ng/mL:  POTENTIAL  MYOCARDIAL INJURY. Repeat  testing in 3-6 hrs if  clinically indicated. NOTE: An increase or decrease  of 30% or more on serial  testing suggests a  clinically important change)  25-Feb-14 00:26   CK, Total  417  CPK-MB, Serum  5.4 (Result(s) reported on 30 Aug 2012 at 01:10AM.)  Troponin I 0.05 (0.00-0.05 0.05 ng/mL or less: NEGATIVE  Repeat testing in 3-6 hrs  if clinically indicated. >0.05 ng/mL: POTENTIAL  MYOCARDIAL INJURY. Repeat  testing in 3-6 hrs if  clinically indicated. NOTE: An increase or decrease  of 30% or more on serial  testing suggests a  clinically important change)    07:52   CK, Total  248  CPK-MB, Serum 3.5 (Result(s) reported on 30 Aug 2012 at 08:37AM.)  Troponin I 0.05 (0.00-0.05 0.05 ng/mL or less: NEGATIVE  Repeat testing in 3-6 hrs  if clinically indicated. >0.05 ng/mL: POTENTIAL  MYOCARDIAL INJURY. Repeat  testing in 3-6 hrs if  clinically indicated. NOTE: An increase or decrease  of 30% or more on serial  testing suggests a  clinically important change)  Routine UA:  24-Feb-14 16:07   Color (UA) Yellow  Clarity (UA) Clear  Glucose (UA) >=500  Bilirubin (UA) Negative  Ketones (UA) Negative  Specific Gravity (UA) 1.027  Blood (UA) 1+  pH (UA) 6.0  Protein (UA) Negative  Nitrite (UA) Negative  Leukocyte Esterase (UA) Negative (Result(s) reported on 29 Aug 2012 at 05:54PM.)  RBC (UA) 1 /HPF  WBC (UA) 1 /HPF  Bacteria (UA) NONE SEEN  Epithelial Cells (UA) NONE SEEN  Result(s) reported on 29 Aug 2012 at 05:54PM.  Routine Hem:  24-Feb-14 16:07   WBC (CBC) 6.4  RBC (CBC)  4.34  Hemoglobin (CBC) 14.3  Hematocrit (CBC) 42.5  Platelet Count (CBC) 193 (Result(s) reported on 29 Aug 2012 at 04:35PM.)  MCV 98  MCH 32.9  MCHC 33.7  RDW 14.1  25-Feb-14 00:26   WBC (CBC) 6.8  RBC (CBC)  4.08  Hemoglobin (CBC) 13.0  Hematocrit (CBC)  39.5  Platelet Count (CBC) 201  MCV 97  MCH 31.9  MCHC 32.9  RDW 14.2  Neutrophil %  71.8  Lymphocyte % 18.3  Monocyte % 6.6  Eosinophil % 2.2  Basophil % 1.1  Neutrophil # 4.9  Lymphocyte # 1.3  Monocyte # 0.5  Eosinophil # 0.2  Basophil # 0.1 (Result(s) reported on 30 Aug 2012 at 12:46AM.)   PERTINENT RADIOLOGY STUDIES: XRay:    24-Feb-14 17:33, Hip Right Complete  Hip Right Complete   REASON FOR EXAM:    ttp, ? fall  COMMENTS:       PROCEDURE: DXR - DXR HIP RIGHT COMPLETE  - Aug 29 2012  5:33PM     RESULT: No acute bony or joint abnormality. Degenerative change;   osteopenia.    IMPRESSION:  Severe degenerative change. No acute abnormality.        Verified By: Osa Craver, M.D., MD    24-Feb-14 17:33, Pelvis AP Only  Pelvis AP Only   REASON FOR EXAM:    ?fall injury  COMMENTS:       PROCEDURE: DXR - DXR PELVIS AP ONLY  - Aug 29 2012  5:33PM     RESULT: No acute bony or joint. Degenerative change. Osteopenia.    IMPRESSION:  No acute abnormality.        Verified By: Osa Craver, M.D., MD  LabUnknown:    24-Feb-14 17:38, CT Head Without Contrast  PACS Image   CT:  CT Head Without Contrast   REASON FOR EXAM:    ams, possible fall  COMMENTS:       PROCEDURE: CT  - CT HEAD WITHOUT CONTRAST  - Aug 29 2012  5:38PM     RESULT: History: Fall.    Comparison Study: CT of 04/11/2012.    Findings: Standard nonenhanced CT obtained. Diffuse atrophy and chronic   white matter ischemic change noted. Old basal ganglia infarcts present.   No acute bony abnormality.    IMPRESSION:  No acute abnormality. No change from 04/11/2012.    Verified By: Osa Craver, M.D., MD   Hospital Course:  Hospital Course See Dr Raylene Miyamoto dc summary, no significant change since then, remains with moderate or more dementia and worsening weakness/ataxia with some evidence for pneumonia, being treated for that therefore. Is stable and ready for his dc to the skilled facility.   Condition on Discharge Satisfactory   DISCHARGE  INSTRUCTIONS HOME MEDS:   Medication Reconciliation: Patient's Home Medications at Discharge:     Medication Instructions  donepezil 10 mg oral tablet  1 tab(s) orally once a day (at bedtime) (8 pm)   lisinopril 40 mg oral tablet  1 tab(s) orally once a day (8 am)   amlodipine 10 mg oral tablet  1 tab(s) orally once a day (8 am)   acetaminophen extended release 650 mg oral tablet, extended release  1 tab(s) orally once a day (8 am) **do not crush or chew**   docusate sodium sodium 100 mg oral capsule  1 cap(s) orally 2 times a day (8 am, 8 pm)   lantus solostar pen 100 units/ml subcutaneous solution  10 unit(s) subcutaneous once a day (at bedtime) (8 pm)   lovastatin 20 mg oral tablet  1 tab(s) orally once a day (at bedtime) (8 pm)   novolog flexpen 100 units/ml subcutaneous solution  9 unit(s) subcutaneous once a day (in the morning) with breakfast, 10 units once a day with lunch, and 5 units once a day (in the evening) with supper (7 am, 12 pm, 430 pm) **hold if patient does not eat**   aspirin 81 mg oral tablet, chewable  1 tab(s) orally once a day     Physician's Instructions:  Diet Carbohydrate Controlled (ADA) Diet   Activity Limitations As tolerated   Return to Work Not Applicable   Time frame for Follow Up Appointment per Market researcher following in the facility   Electronic Signatures: Kirk Ruths (MD)  (Signed 02-Mar-14 10:10)  Authored: ADMISSION DATE AND DIAGNOSIS, CHIEF COMPLAINT/HPI, PERTINENT LABS, PERTINENT RADIOLOGY STUDIES, HOSPITAL COURSE, DISCHARGE INSTRUCTIONS HOME MEDS, PATIENT INSTRUCTIONS   Last Updated: 02-Mar-14 10:10 by Kirk Ruths (MD)

## 2014-10-26 NOTE — H&P (Signed)
Subjective/Chief Complaint Generalized weakness. Difficulty in standing and Ambulation   History of Present Illness 81 y/o male with hx of Type 1 DM, HTN, Dementia sent to ER from [facility] - c/o Genealized weaknss and difficulty in ambulation for the last 2-3 weeks. Pt has dementia and is a poor historian However he denies chest pains or  shortness of breath. C/o Pain in rt hip and states weakness is worse in Rt leg States he vomited once approx 1 week back No headaches or dizziness  has not been sleepin well. Appetite is fair  Constipation + Vision is ok Blood sugars are elevated at 522, EKG: Paced rhythm   Past Medical Health Hypertension, Diabetes Mellitus, Dementia   Primary Physician Dr. Netty Starring   Past Med/Surgical Hx:  BPH - Benign Prostatic Hypertrophy:   PACER:   Dementia:   CVA:   TIA - Transient Ischemic Attack:   hyperlipidemia:   hypertension:   Diabetes Mellitus,Type I (IDD):   Tonsillectomy:   Appendectomy:   ALLERGIES:  No Known Allergies:    Other Allergies Lipitor. Altace   Family and Social History:  Family History ex smoker- quit approx 20 yrs bck   Place of Living [facility]   Review of Systems:  Fever/Chills No   Cough Yes   Sputum No   Abdominal Pain No   Diarrhea No   Constipation Yes   Nausea/Vomiting Yes   SOB/DOE No   Chest Pain No   Telemetry Reviewed NSR   Tolerating Diet Yes   Physical Exam:  GEN disheveled   HEENT PERRL   NECK No masses   RESP clear BS   CARD regular rate   ABD denies tenderness  normal BS   EXTR negative edema   SKIN No rashes   NEURO cranial nerves intact, Generalized weaknsss. Inabilty to stand   [facility] alert, poor insight, Dementia   Lab Results:  Hepatic:  24-Feb-14 16:07   Bilirubin, Total 0.7  Alkaline Phosphatase 90  SGPT (ALT) 22  SGOT (AST) 35  Total Protein, Serum 6.8  Albumin, Serum  3.0  Routine Chem:  24-Feb-14  16:07   Glucose, Serum  522  BUN 18  Creatinine (comp) 0.83  Sodium, Serum 137  Potassium, Serum 4.0  Chloride, Serum 104  CO2, Serum 24  Calcium (Total), Serum  8.2  Osmolality (calc) 299  eGFR (African American) >60  eGFR (Non-African American) >60 (eGFR values <71m/min/1.73 m2 may be an indication of chronic kidney disease (CKD). Calculated eGFR is useful in patients with stable renal function. The eGFR calculation will not be reliable in acutely ill patients when serum creatinine is changing rapidly. It is not useful in  patients on dialysis. The eGFR calculation may not be applicable to patients at the low and high extremes of body sizes, pregnant women, and vegetarians.)  Result Comment glucose - RESULTS VERIFIED BY REPEAT TESTING.  - NOTIFIED OF CRITICAL VALUE  - mpg c/ mark winstead @ 1650 08/29/12  - READ-BACK PROCESS PERFORMED. potassium/ast - Slight hemolysis, interpret results with  - caution.  Result(s) reported on 29 Aug 2012 at 04:52PM.  Anion Gap 9  Cardiac:  24-Feb-14 16:07   Troponin I 0.03 (0.00-0.05 0.05 ng/mL or less: NEGATIVE  Repeat testing in 3-6 hrs  if clinically indicated. >0.05 ng/mL: POTENTIAL  MYOCARDIAL INJURY. Repeat  testing in 3-6 hrs if  clinically indicated. NOTE: An increase or decrease  of 30% or more on serial  testing  suggests a  clinically important change)  Routine UA:  24-Feb-14 16:07   Color (UA) Yellow  Clarity (UA) Clear  Glucose (UA) >=500  Bilirubin (UA) Negative  Ketones (UA) Negative  Specific Gravity (UA) 1.027  Blood (UA) 1+  pH (UA) 6.0  Protein (UA) Negative  Nitrite (UA) Negative  Leukocyte Esterase (UA) Negative (Result(s) reported on 29 Aug 2012 at 05:54PM.)  RBC (UA) 1 /HPF  WBC (UA) 1 /HPF  Bacteria (UA) NONE SEEN  Epithelial Cells (UA) NONE SEEN  Result(s) reported on 29 Aug 2012 at 05:54PM.  Routine Hem:  24-Feb-14 16:07   WBC (CBC) 6.4  RBC (CBC)  4.34  Hemoglobin (CBC) 14.3  Hematocrit (CBC)  42.5  Platelet Count (CBC) 193 (Result(s) reported on 29 Aug 2012 at 04:35PM.)  MCV 98  MCH 32.9  MCHC 33.7  RDW 14.1   Radiology Results: XRay:    17-Apr-13 00:41, Hip Right Complete  Hip Right Complete  REASON FOR EXAM:    pain  COMMENTS:       PROCEDURE: DXR - DXR HIP RIGHT COMPLETE  - Oct 21 2011 12:41AM     RESULT: Right hip evaluation 10/21/2011. There is no evidence of fracture,   dislocation, or malalignment.    IMPRESSION:     1. No evidence of acute abnormalities.   2. If there are persistent complaints of pain or persistent clinical   concern, a repeat evaluation in 7-10 days is recommended if clinically   warranted.     Thank you for the opportunity to contribute to the care of your patient.          Verified By: Mikki Santee, M.D., MD    [id number] 13:49, Chest Portable Single View  Chest Portable Single View  REASON FOR EXAM:    Pacemaker  COMMENTS:       PROCEDURE: DXR - DXR PORTABLE CHEST SINGLE VIEW  - Feb 25 2012  1:49PM     RESULT: Comparison: 01/18/2012    Findings:  There has been interval placement of a single-lead pacemaker, terminating   over theright ventricle. Heart and mediastinum are stable. The lungs are   hyperinflated. Retrocardiac opacity is similar to prior.    IMPRESSION:   1. Pacemaker wire overlies the right ventricle. No pneumothorax seen.  2. Unchanged retrocardiac opacity. This may be secondary to atelectasis     or infection. Followup PA and lateral chest radiographs are recommended   to ensure resolution.    Dictation site: 2          Verified By: Gregor Hams, M.D., MD  CT:    28-Feb-13 08:46, CT Head Without Contrast  CT Head Without Contrast  REASON FOR EXAM:    fall, scalp laceration over occiput, hx dementia  COMMENTS:       PROCEDURE: CT  - CT HEAD WITHOUT CONTRAST  - Sep 03 2011  8:46AM     RESULT: Axial noncontrast CT scanning was performed through the brain   with reconstructions at5 mm intervals  and slice thicknesses. Comparison   is made to the study of June 29, 2011.    There is mild diffuse cerebral and cerebellar atrophy with compensatory   ventriculomegaly. These findings are stable. There is an old lacunar   infarction in the anterior aspect of the left basal ganglia and more   peripherally in the midportion of the left basal ganglia. There is no   intracranial hemorrhage. There is no evidence of an evolving  ischemic   infarction. At bone window settings the observed portions of the     paranasal sinuses and mastoid air cells are clear.    IMPRESSION:   1. I do not see evidence of an acute intracranial hemorrhage.  2. There are chronic age related changes within the brain as well as   evidence of previous lacunar infarctions in the left basal ganglia.  3. There is no evidence of an acute skull fracture.          Verified By: DAVID A. Martinique, M.D., MD    Assessment/Admission Diagnosis 1Generalized Weakness;  Difficulty in ambulation Probably secondary to Deconditioning  I dont see a clear source of infection .Will cycle cardiac enzymes Ct Head- Negative Will consult Physical therapy Pt will most likely need to go to SNF 2Type 1 DM: Poorly controlled-On Lantus 10 units s/q q at bedtime. Will place pt on Sliding scale insulin and continue to monitor sugars 3 HTN.: Continue Lisnopril and Amlodipine 4 Dementia: On Aricept 5 Hx of BPH: On Detrol LA 6 Rt hip pain:Evidence for degenerative changes noted on X ray. No clear fractures 6 Hyperlipidemia: Hold statins for now   Plan As above. Pt is a full code   Electronic Signatures: Tracie Harrier (MD)  (Signed 24-Feb-14 20:35)  Authored: CHIEF COMPLAINT and HISTORY, PAST MEDICAL/SURGIAL HISTORY, ALLERGIES, Other Allergies, HOME MEDICATIONS, FAMILY AND SOCIAL HISTORY, REVIEW OF SYSTEMS, PHYSICAL EXAM, LABS, Radiology, ASSESSMENT AND PLAN   Last Updated: 24-Feb-14 20:35 by Tracie Harrier (MD)

## 2014-10-26 NOTE — Discharge Summary (Signed)
PATIENT NAME:  Ryan MinionBOSWELL, [place] P MR#:  [id number] DATE OF BIRTH:  01-30-1930  DATE OF ADMISSION:  02/15/2013 DATE OF DISCHARGE:  02/16/2013  FINAL DIAGNOSES:  1.  Hypoglycemic coma secondary to uncontrolled diabetes.  2.  Aspiration pneumonitis secondary to #1.  3.  Hypertension.  4.  Hyperlipidemia.  5.  Gastroesophageal reflux disease.  6.  Peripheral vascular disease.  7.  Dementia. 8.  History of bradycardia with pacemaker placement.   HISTORY AND PHYSICAL: Please see dictated admission history and physical.   SUMMARY OF HOSPITAL COURSE: The patient was admitted with hypoglycemia and unresponsiveness, sugars into the 30s. There was some challenge with caretakers administering insulin in terms of type and frequency and training was supplied. He was found to have evidence of left lower lobe pneumonia, thought likely related to aspiration episode during his hyperglycemia. As his sugars improved his mental status improved significantly as did his mobility, though he still is significantly debilitated. Further adjustments were made with the help of endocrinology and he did have some continuing trouble with hypoglycemia; however, regimen was found that appeared to allow his sugars to run into a reasonable range. Discussion was held with the patient's family members as well as caretaker that the goal for this patient is not close glucose control, and in fact that would likely be detrimental to him. We are aiming as a target of 250 or less in fasting sugars for now.   Physical therapy ambulated the patient initially and he was max assistance x 2. Family members wished to take him home where he has 24-hour care and we discussed some concerns with this. With assistance of nursing and the caretaker; however, we monitored the patient ambulating in the room and he was able to get up with a walker and ambulate under his own power on the day of discharge and caretaker feels like this is his baseline. Discussed  with his family members regarding our concerns as well and home health and physical therapy and nursing was recommended and arranged. Discussed that the patient's overall prognosis is poor given his fragility as well as his multiple other medical issues. We discussed CODE STATUS and they felt that he would wish to be resuscitated, although he would not want to be maintained on life support. To this end, he remains full code and it is recommended this be further discussed in the future with his primary care physician.   At this time, the patient will be discharged to home in stable condition with his physical activity to be up with a walker as tolerated. Home health physical therapy and nursing will be called in to see the patient to help improve his strength and balance as well as to monitor insulin administration, sugars, oral intake and home safety. He has followup with Dr. Tedd SiasSolum coming up in the next 1 week and he will keep this appointment. His diet should be mechanical soft, no concentrated sweets with assistance with all meals and moisten foods well alternating between foods and liquids.  DISCHARGE MEDICATIONS:  1.  Lisinopril 40 mg p.o. daily.  2.  Amlodipine 10 mg p.o. daily.  3.  Aspirin 81 mg p.o. daily.  4.  NovoLog sliding scale as previously discussed, 1 unit for 201 to 250, 2 units for 251 to 300, 3 units for 301 to 350 and 4 units for 351 to 400.  5.  Insulin aspart 4 units subcutaneous 3 times a day to be given before meals, not to begin if  the patient is not eating.  6.  Insulin glargine 8 units subcutaneous at bedtime.  7.  Ceftin 500 mg p.o. b.i.d. x 7 days.  8.  Azithromycin 250 mg p.o. daily x 4 days.   ____________________________ Lynnea Ferrier, MD [email or web address] D: 02/16/2013 13:06:11 ET T: 02/16/2013 14:37:00 ET JOB#: [id number]  cc: Lynnea Ferrier, MD, <Dictator> A. Wendall Mola, MD Marisue Ivan, MD Daniel Nones MD ELECTRONICALLY SIGNED 02/21/2013 12:57

## 2014-10-27 NOTE — H&P (Signed)
PATIENT NAME:  Ryan Wall, Ryan Wall MR#:  [id number] DATE OF BIRTH:  06/27/1930  DATE OF ADMISSION:  11/12/2013  PRIMARY CARE PROVIDER: Dr. Burnadette PopLinthavong  EMERGENCY DEPARTMENT REFERRING PHYSICIAN: Dr. Shaune PollackLord.   CHIEF COMPLAINT: Altered mental status, fever.   HISTORY OF PRESENT ILLNESS: The patient is an 81 year old white male with history of multiple medical problems including type 1 diabetes. hyperlipidemia, hypertension, GERD, peripheral vascular disease and multinodular goiter, dementia, transient ischemic attack who lives alone at home, but has a caregiver that provides care, who is brought in with his son who is at the bedside. He reports that his father normally is doing okay and uses a wheelchair to get around, who is brought in by son because he started having fevers since yesterday at 3:00 Wall.m. The temperature was 101 and then they noticed his temperature was 103. The patient also has been very sleepy and lethargic. Has been coughing. He was admitted in August 2014 with pneumonia as well. In the ED again, this time, he is noted to have pneumonia on the chest x-ray with worsening right lower lobe airspace disease. The patient currently is very drowsy and unable to give me any history.   PAST MEDICAL HISTORY:  1. Diabetes type 1.  2. Hyperlipidemia.  3. Hypertension.  4. Gastroesophageal reflux disease.  5. Peripheral vascular disease.  6. Multinodular goiter.  7. Dementia.  8. History of transient ischemic attack.  9. Bradycardia status post pacemaker placement in 02/2012.  10. Benign prostatic hypertrophy.   ALLERGIES: ALTACE AND LIPITOR.   SOCIAL HISTORY: Currently resides at home. Does not smoke or drink. He  does not smoke or drink.   FAMILY HISTORY: There is a history of hypertension.   MEDICATIONS: Tolterodine 4 mg daily, lovastatin 20 daily, lisinopril 40 daily, insulin Lantus 9 units at bedtime, insulin aspartate  4 units t.i.d. schedule with sliding scale, Dulcolax 5 mg daily as  needed, aspirin 81 mg 1 tab Wall.o. daily, amlodipine 10 daily.   REVIEW OF SYSTEMS: Unobtainable due to the patient being very lethargic.   PHYSICAL EXAMINATION: VITAL SIGNS: Temperature 100.2, pulse 74, respirations 19, blood pressure 175/58, O2 94% on 2 liters.  GENERAL: The patient is a chronically ill-appearing male, currently not in any acute distress.  HEENT: Head atraumatic, normocephalic. Pupils equally round, reactive to light and accommodation. There is no conjunctival pallor. No scleral icterus. Nasal exam shows no drainage or ulceration.  OROPHARYNX: The patient refuses to open his mouth to examine.  NECK: Supple without any JVD. Thyroid is nonenlarged.  LUNGS: He has rhonchi in the right lower base. No wheezing, no accessory muscle usage.  CARDIOVASCULAR: S1, S2 positive. No murmurs, rubs, clicks, or  gallops.  EXTREMITIES: Good DP, PT pulses.  ABDOMEN: Soft, nontender, nondistended. Positive bowel sounds x 4. No hepatosplenomegaly.  MUSCULOSKELETAL: There is no erythema or swelling.  SKIN: There is no rashes.  LYMPHATICS: No lymph nodes palpable.  NEUROLOGICAL: The patient is currently very drowsy, hard to arouse so examination is limited.  PSYCHIATRIC: Not anxious.   EVALUATION IN THE EMERGENCY DEPARTMENT:  CT scan of the head without contrast showed stable involutional changes with chronic lacunar infarcts. No acute abnormality. Urinalysis is nitrites negative, leukocytes negative. Chest x-ray shows increasing right lower lobe airspace disease. Glucose 108, BUN 23, creatinine 1.06, sodium 141, potassium 3.3, chloride 110, CO2 25, calcium 8.5. WBC 6.4, hemoglobin 11.8, platelet count 175. Troponin 0.12.   ASSESSMENT AND PLAN: The patient is an 81 year old white male with history  of dementia, hypertension, hyperlipidemia, history of pneumonia, diabetes, who presents with fever, altered mental status.  1. Fever altered mental status, likely due to recurrent pneumonia. At this time,  I will treat him with IV Levaquin. Follow blood cultures, sputum cultures. We will get a swallow evaluation when more awake.  2. Diabetes type 2. Since he will not be eating and n.Wall.o. we will place him on sliding scale, hold Lantus.  3. Hyperlipidemia. We will hold lovastatin.  4. Hypokalemia. We will replace potassium in his IV fluids.  5. Elevated troponin likely due to demand ischemia. We will follow his troponin.  6. Hypertension. We will hold lisinopril and amlodipine, since he will be n.Wall.o.  7. Miscellaneous. The patient will be on Lovenox for deep vein thrombosis prophylaxis.   TIME SPENT ON THIS PATIENT: Is 50 minutes.    ____________________________ Lacie Scotts Allena Katz, MD shp:sg D: 11/12/2013 13:21:04 ET T: 11/12/2013 13:52:11 ET JOB#: [id number]  cc: Lisette Mancebo H. Allena Katz, MD, <Dictator> Charise Carwin MD ELECTRONICALLY SIGNED 11/17/2013 8:33

## 2014-10-27 NOTE — Discharge Summary (Signed)
PATIENT NAME:  Ryan Wall, Ryan Wall MR#:  [id number] DATE OF BIRTH:  Sep 20, 1929  DATE OF ADMISSION:  11/12/2013 DATE OF DISCHARGE: 11/17/2013   DISCHARGE DIAGNOSES:  1.  Altered mental status that is resolved.  2.  Progressive dementia characterized as moderate.  3.  Insulin-dependent diabetes.  4.  Hypertension.  5.  Hyperlipidemia.  6.  Chronic urinary frequency with benign prostatic hypertrophy.   DISCHARGE MEDICATIONS:  1.  Lisinopril 40 mg Wall.o. daily.  2.  Aspirin 81 mg Wall.o. daily.  3.  Lantus 9 units daily.  4.  Insulin Aspart 4 units t.i.d. with meals. Do not give if the patient is not eating.  5.  Detrol extended release 4 mg Wall.o. daily.  6.  Lovastatin 20 mg Wall.o. at bedtime.  7.  Dulcolax 5 mg 1 tab daily as needed for constipation.   CONSULTS: Hospice.   PROCEDURES: None.   PERTINENT LABS AND STUDIES: Blood cultures: No growth to date. CT of the head was negative. Urinalysis negative. Urine culture negative. Prior to discharge, sodium 143, potassium 4.4, creatinine 0.81, glucose 181. Chest x-ray showed persistent air space opacity in the left retrocardiac lung base.   BRIEF HOSPITAL COURSE:  1.  Altered mental status. The patient initially came in with changes in mental status with underlying moderate dementia. There is concern for possible pneumonia. Upon admission, chest x-ray did show a persistent air space opacity in left retrocardiac lung base and he had that coincided with a fever. The patient did not have a cough and oxygen saturation remained stable. White blood cell count was within normal limits and he was afebrile the rest of his hospital stay. He was kept on the antibiotics for 48 hours and discontinued after that. Cultures were all negative. It is thought that he is back to baseline status with moderate dementia. Because of his progressive decline, I asked hospice to evaluate the patient and he is going to return home with 24-hour supervision and hospice services.  2.   Other chronic issues remain stable at this time. Will continue with his home medications.   DISPOSITION: He is in stable condition to be discharged home with 24-hour supervision and hospice services. He will follow-up as needed as outpatient, otherwise hospice will follow him at his home.  ____________________________ Marisue IvanKanhka Chastity Noland, MD kl:aw D: 11/17/2013 08:05:45 ET T: 11/17/2013 08:54:08 ET JOB#: [id number]  cc: Marisue IvanKanhka Monte Zinni, MD, <Dictator> Marisue IvanKANHKA Zelta Enfield MD ELECTRONICALLY SIGNED 12/04/2013 8:27

## 2014-10-28 NOTE — H&P (Signed)
PATIENT NAME:  Ryan Wall, Ryan Wall MR#:  [id number] DATE OF BIRTH:  1930-02-25  DATE OF ADMISSION:  01/17/2012  REFERRING PHYSICIAN: Lowella FairyJohn Woodruff, MD  FAMILY PHYSICIAN: [name], MD  REASON FOR ADMISSION: Abdominal pain with failure to thrive.   HISTORY OF PRESENT ILLNESS: The patient is an 81 year old male who resides at the [facility] [facility] with a significant history of previous stroke, dementia, diabetes, and hypertension. He has been having progressive back and abdominal pain over the past 4 to 6 weeks with no definitive diagnosis. This is his third trip to the Emergency Room. Today, in the Emergency Room, the patient was noted to have a blood sugar greater than 400. CT of the abdomen suggested some pancreatic abnormality which was unclear. He was also noted to have bilateral infiltrates consistent with pneumonia. He is now admitted for further evaluation.   PAST MEDICAL HISTORY:  1. Alzheimer's dementia.  2. Previous stroke.  3. Hyperlipidemia.  4. Benign hypertension.  5. Type 2 diabetes, on insulin.  6. Chronic low back pain.  7. Benign prostatic hypertrophy.  8. Status post appendectomy.   MEDICATIONS:  1. Flomax 0.4 mg Wall.o. twice a day. 2. K-Dur 20 milliequivalents Wall.o. daily.  3. NovoLog 10 units subcutaneous three times daily. 4. Myrbetriq 25 mg Wall.o. daily.  5. Mevacor 20 mg Wall.o. daily.  6. Zestril 20 mg Wall.o. daily.  7. Lantus 12 units subcutaneous at bedtime.  8. Lasix 20 mg Wall.o. daily.  9. Aricept 10 mg Wall.o. daily.  10. Plavix 75 mg Wall.o. daily.  11. Detrol LA 4 mg Wall.o. daily.   ALLERGIES: No known drug allergies.   SOCIAL HISTORY: The patient used to smoke but not recently. No history of alcohol abuse.   FAMILY HISTORY: Positive for diabetes, hypertension, and stroke.   REVIEW OF SYSTEMS: Unable to obtain from the patient due to his dementia.   PHYSICAL EXAMINATION:   GENERAL: The patient is acutely ill-appearing, in moderate distress.   VITAL SIGNS: Vital signs  are remarkable for a blood pressure of 192/75 with a heart rate of 58 and a respiratory rate of 18. He is afebrile.   HEENT: Normocephalic, atraumatic. Pupils equally round and reactive to light and accommodation. Extraocular movements are intact. Sclerae icteric. Conjunctivae are clear. Oropharynx is clear.   NECK: Supple without jugular venous distention. No adenopathy or thyromegaly is noted.   LUNGS: Clear to auscultation and percussion, except for basilar rhonchi. No wheezes or rales. No dullness.   CARDIAC: Regular rate and rhythm. Normal S1 and S2. No significant rubs, murmurs, or gallops. PMI is nondisplaced. Chest wall is nontender.   ABDOMEN: Soft but tender in the epigastrium and upper quadrants. There is guarding but no rebound. Normoactive bowel sounds. No organomegaly or masses were appreciated. No hernias or bruits were noted.   EXTREMITIES: No clubbing, cyanosis, or edema. Pulses were 2+ bilaterally.   SKIN: Warm and dry without rash or lesions.   NEUROLOGIC: Cranial nerves II through XII grossly intact. Deep tendon reflexes were symmetric. Motor and sensory exam is nonfocal.   PSYCH: The patient was semi-alert and was oriented to person but not place or time.   LABORATORY AND RADIOLOGIC DATA: Urinalysis was unremarkable.   White count was 7.3 with a hemoglobin of 13.3. Glucose was 404 with a BUN of 18 and a creatinine 0.87 with a sodium of 137 and a potassium of 4.0. Lipase was 170. Troponin was 0.03. Lactic acid was 1.3.   CT of the abdomen revealed  bibasilar infiltrates. There were also some changes consistent with pancreatitis despite the normal lipase.   ASSESSMENT:  1. Intractable abdominal pain with anorexia and weight loss.  2. Chronic back pain.  3. Bilateral pneumonia.  4. Failure to thrive.  5. Dementia.  6. Benign hypertension.  7. Hyperlipidemia.  8. Type 2 diabetes, on insulin.   PLAN: The patient will be admitted to the floor and started on IV fluids  with IV antibiotics. We will send off blood cultures. Follow up chest x-ray in the morning. We will follow his sugars with Accu-Cheks before meals and at bedtime and add sliding scale insulin as needed. We will consult Gastroenterology in regards to the patient's abdominal pain and pancreatic abnormalities noted on CT. We will follow up chest x-ray in the morning. Follow up routine labs in the morning as well. We will consult physical therapy as the patient has gotten progressively weak. We will consult the case manager for skilled nursing placement. We will begin empiric SVNs with IV antibiotics because of the pneumonia. We will check LS-spine and thoracic spine films        today given the patient's back pain. Further treatment and evaluation will depend upon the patient's progress.   TOTAL TIME SPENT: 50 minutes.  ____________________________ Duane Lope Judithann Sheen, MD jds:slb D: 01/17/2012 21:30:22 ET T: 01/18/2012 08:40:06 ET JOB#: [id number]  cc: Duane Lope. Judithann Sheen, MD, <Dictator> Marisue Ivan, MD Melania Kirks Rodena Medin MD ELECTRONICALLY SIGNED 01/22/2012 20:44

## 2014-10-28 NOTE — Discharge Summary (Signed)
PATIENT NAME:  Ryan Wall, Ryan Wall MR#:  [id number] DATE OF BIRTH:  1930-02-19  DATE OF ADMISSION:  06/29/2011 DATE OF DISCHARGE:  07/03/2011  DISCHARGE DIAGNOSES:  1. Altered mental status secondary to viral encephalopathy that has resolved.  2. Mild to moderate dementia.  3. Hypertension.  4. Insulin-dependent diabetes.  5. Hyperlipidemia.  6. History of transient ischemic attack.  7. Hypokalemia.  8. Falls risk.   DISCHARGE MEDICATIONS:  1. Amlodipine 10 mg p.o. daily.  2. Aspirin 81 mg p.o. daily.  3. Detrol LA 4 mg p.o. daily.  4. Plavix 75 mg p.o. daily.  5. Lovastatin 20 mg p.o. daily.  6. Lantus 15 units at bedtime.  7. Tamsulosin 0.4 mg, 2 capsules daily.  8. Lisinopril 20 mg p.o. daily.  9. NovoLog sliding scale per Dr. Joycie Peek directions. 10. Potassium chloride 20 mEq p.o. b.i.d.  11. Aricept 5 mg p.o. at bedtime.   PROCEDURES: None.   CONSULTS: Palliative care consult was performed.   PERTINENT LABS ON DAY OF DISCHARGE: Potassium 2.9, creatinine of 0.67, BUN 18, glucose of 86, sodium 146, A1c of 8.8%.   OTHER PERTINENT LABS: Head CT negative. MRI of the brain negative.   BRIEF HOSPITAL COURSE: This is a 81 year old male admitted with altered mental status.  1. Altered mental status secondary to viral encephalopathy, which has resolved: The patient underwent a head CT that was negative for acute process, also underwent MRI of the brain that was negative for acute process. His altered mental status is likely multifactorial with recent viral illness and also with declining dementia.  2. Mild to moderate dementia that is progressively declining: He will likely need titration of his Aricept to 10 mg and also addition of Namenda as an outpatient.  3. Hypokalemia:  He was noted to have a potassium of 2.9 on day of discharge. We will replete that orally as stated above.  4. Hypertension: Remained stable.  5. Insulin-dependent diabetes: Remained stable.   DISPOSITION: He is a  falls risk and he is declining. Therefore a palliative care consult was obtained who did recommend Home Health. He will need Home Health physical therapy as an outpatient for his falls risk.  He will need followup with Dr. Netty Starring in 1 to 2 weeks.  He will need a B-MET in one week to reassess his potassium level. He is going to be discharged to home again with Home Health and with his son Merry Proud.   ____________________________ Dion Body, MD kl:bjt D: 07/03/2011 17:23:38 ET T: 07/06/2011 15:06:50 ET JOB#: [id number]  cc: Dion Body, MD, <Dictator> Dion Body MD ELECTRONICALLY SIGNED 07/21/2011 8:31

## 2014-12-30 IMAGING — CT CT HEAD WITHOUT CONTRAST
2 series · 16 of 30 positions shown, 20 images · non-contrast
Comparison: CT HEAD W/O CM dated 04/22/2013

CLINICAL DATA: Left facial droop, fever, history of dementia and
stroke

EXAM:
CT HEAD WITHOUT CONTRAST
TECHNIQUE: Contiguous axial images were obtained from the base of the skull
through the vertex without intravenous contrast.

[Series 2: soft tissue · axial · 0.43mm/px · z∈[-102,+28]mm · 13 of 32 slices shown, 17 images]
[im 3/32  brain]
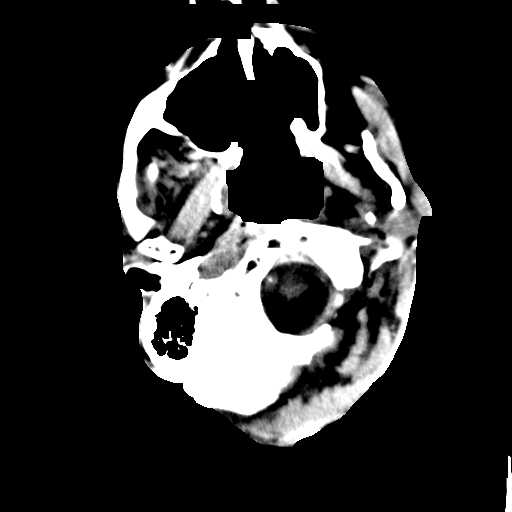
[im 3/32  bone]
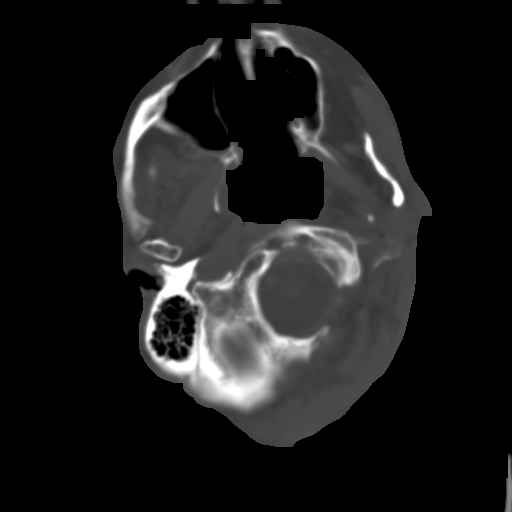
[im 5/32  brain]
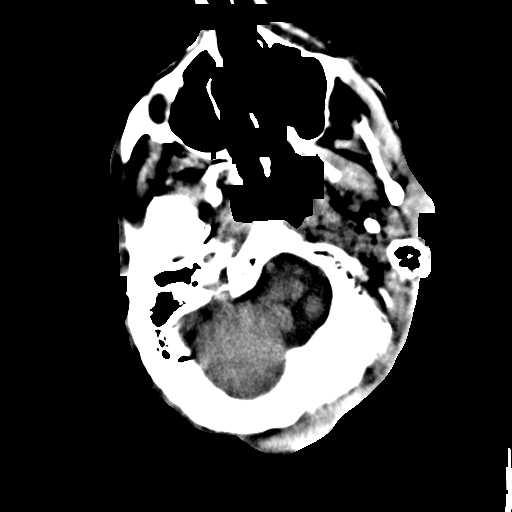
[im 7/32  brain]
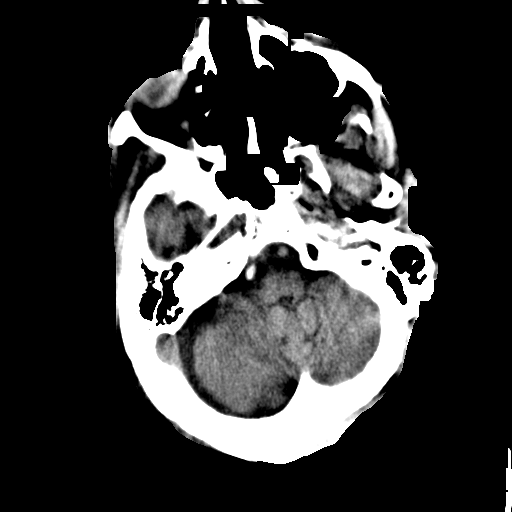
[im 9/32  brain]
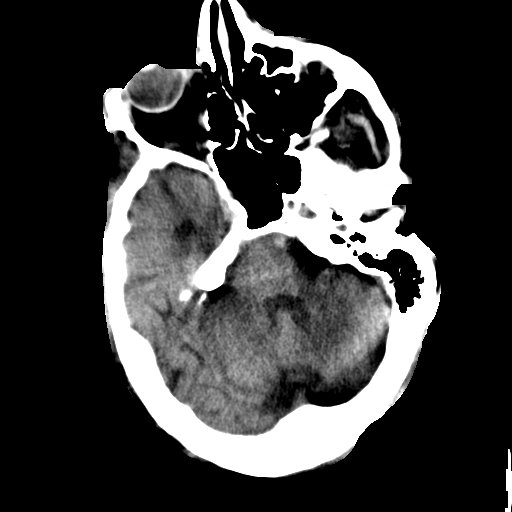
[im 12/32  brain]
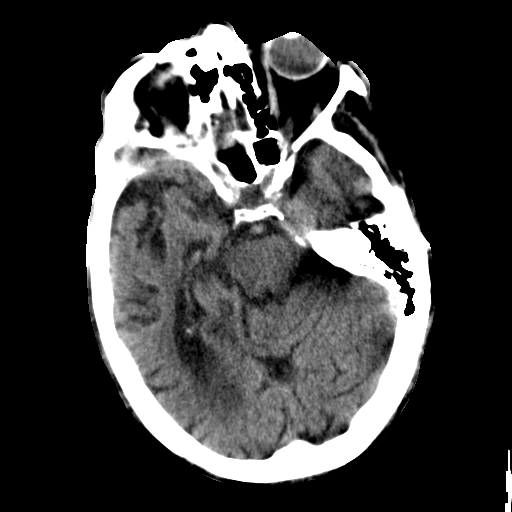
[im 12/32  bone]
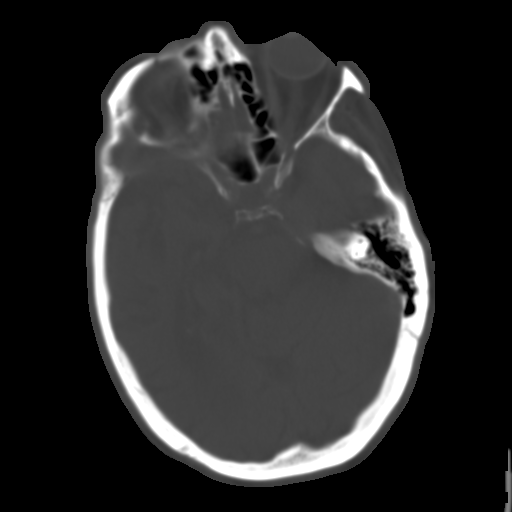
[im 14/32  brain]
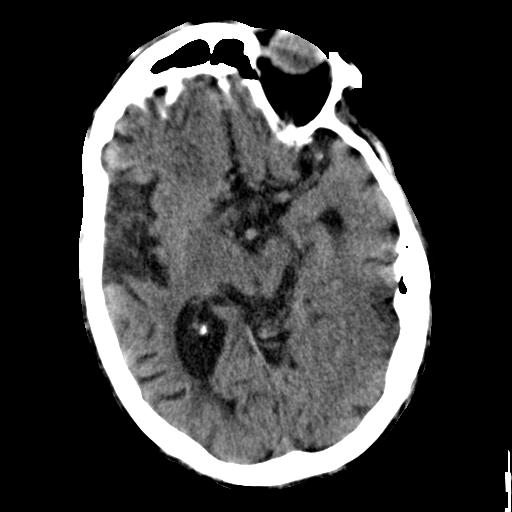
[im 16/32  brain]
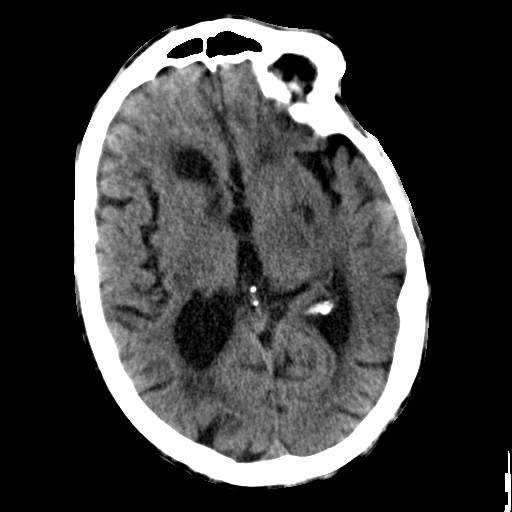
[im 18/32  brain]
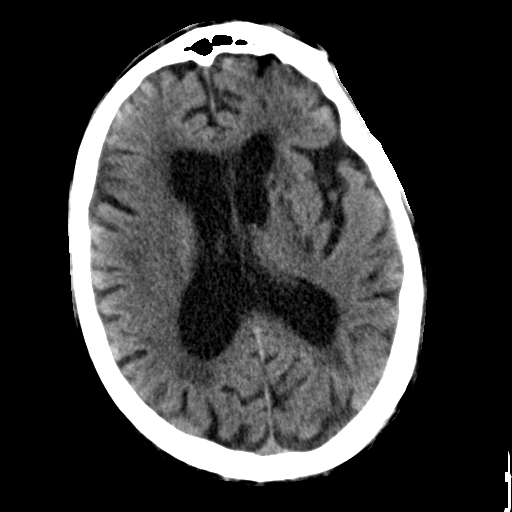
[im 20/32  brain]
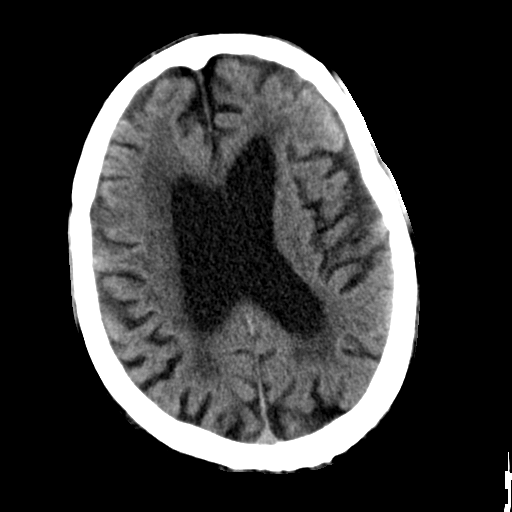
[im 20/32  bone]
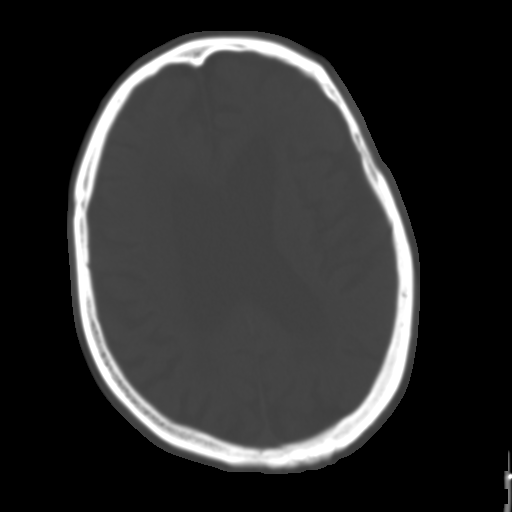
[im 23/32  brain]
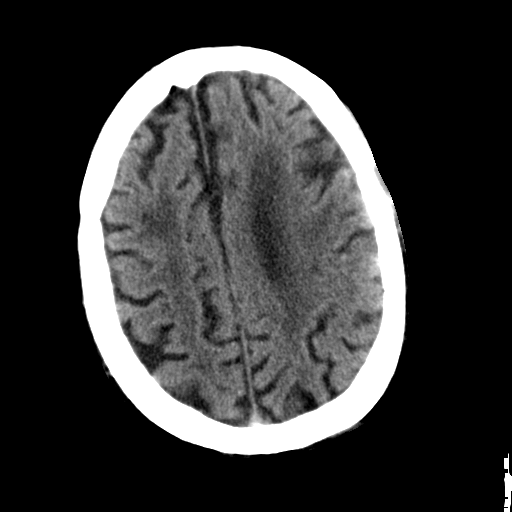
[im 25/32  brain]
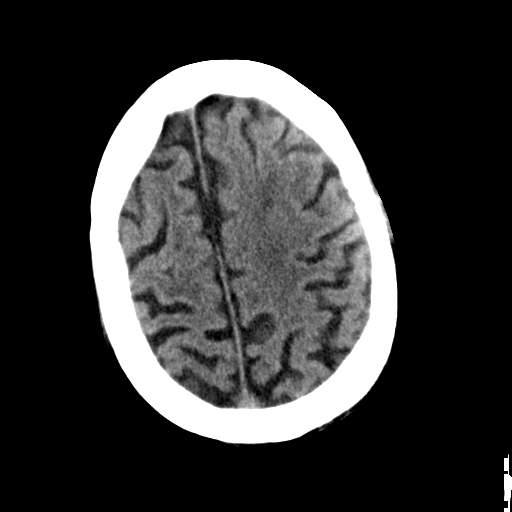
[im 27/32  brain]
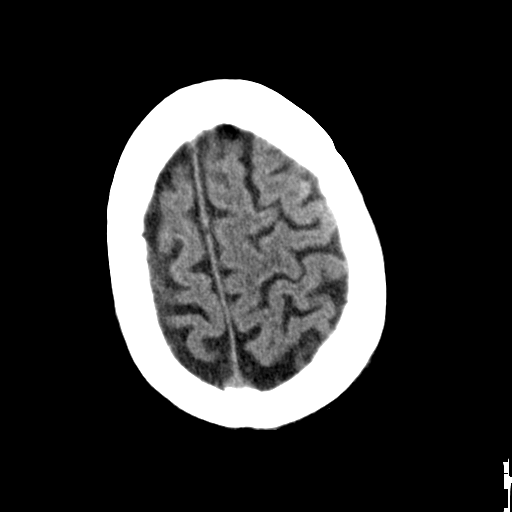
[im 29/32  brain]
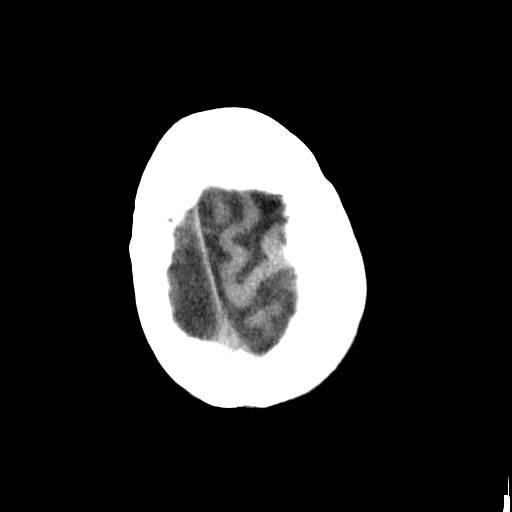
[im 29/32  bone]
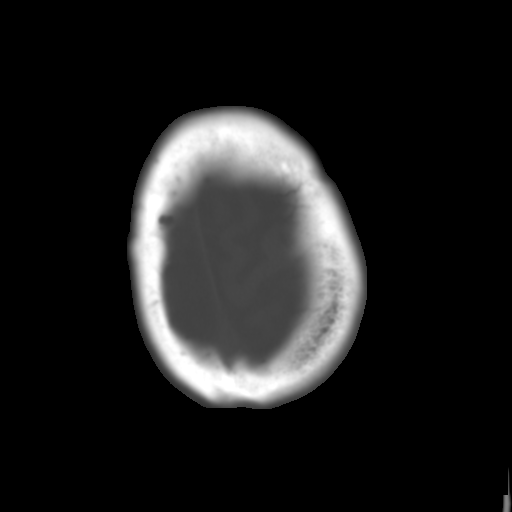

[Series 4: soft tissue recon · axial · 0.45mm/px · z∈[-68,-32]mm · 3 of 31 slices shown]
[im 3/31  brain]
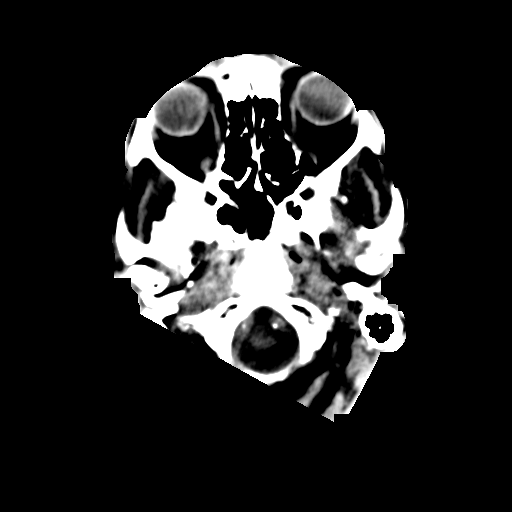
[im 7/31  brain]
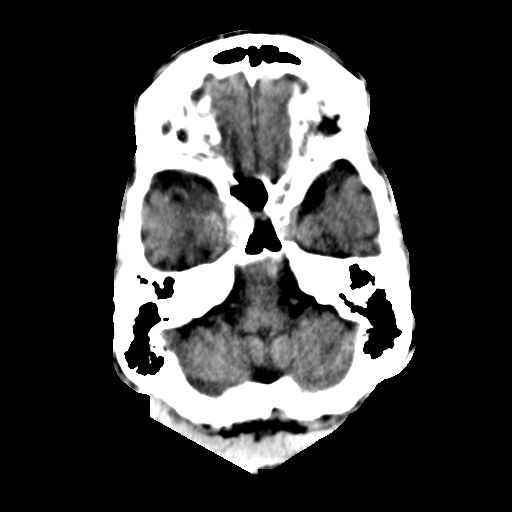
[im 11/31  brain]
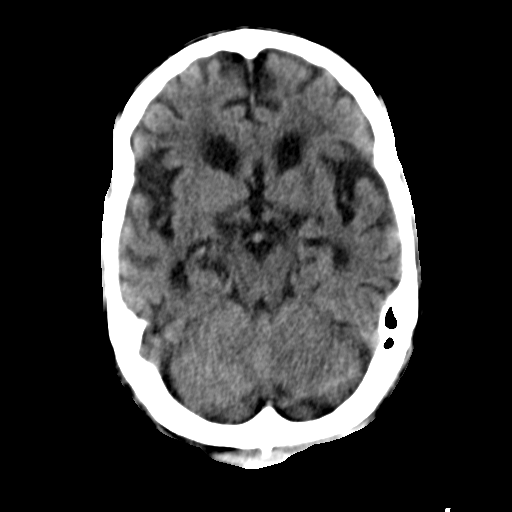

[16 of 30 positions shown; findings below may reference images not displayed]

FINDINGS: Significant diffuse atrophy and low attenuation in the deep white
matter. Chronic lacunar infarcts in the region of left basal
ganglia. No evidence of acute vascular territory infarct. No
hemorrhage or extra-axial fluid. No evidence of intracranial mass.
No hydrocephalus. No skull fracture.
IMPRESSION: Stable involutional change and chronic lacunar infarcts. No acute
findings.
